# Patient Record
Sex: Male | Born: 1985 | Race: White | Hispanic: No | Marital: Single | Smoking: Never smoker
Health system: Southern US, Community
[De-identification: ages and names within clinical notes are randomized; demographics above are authoritative.]

## PROBLEM LIST (undated history)

## (undated) DIAGNOSIS — Z789 Other specified health status: Secondary | ICD-10-CM

## (undated) HISTORY — PX: TONSILLECTOMY: SUR1361

---

## 2011-09-21 ENCOUNTER — Other Ambulatory Visit: Payer: Self-pay | Admitting: Orthopedic Surgery

## 2011-09-21 ENCOUNTER — Ambulatory Visit
Admission: RE | Admit: 2011-09-21 | Discharge: 2011-09-21 | Disposition: A | Discharge status: Home / Self Care | Payer: Self-pay | Source: Ambulatory Visit | Attending: Orthopedic Surgery | Admitting: Orthopedic Surgery

## 2011-09-21 DIAGNOSIS — M25539 Pain in unspecified wrist: Secondary | ICD-10-CM

## 2013-06-01 ENCOUNTER — Encounter (HOSPITAL_COMMUNITY): Payer: Self-pay | Admitting: Emergency Medicine

## 2013-06-01 ENCOUNTER — Emergency Department (HOSPITAL_COMMUNITY)
Admission: EM | Admit: 2013-06-01 | Discharge: 2013-06-01 | Disposition: A | Discharge status: Home / Self Care | Payer: BC Managed Care – PPO | Source: Home / Self Care

## 2013-06-01 DIAGNOSIS — L259 Unspecified contact dermatitis, unspecified cause: Secondary | ICD-10-CM

## 2013-06-01 MED ORDER — TRIAMCINOLONE ACETONIDE 0.1 % EX CREA: TOPICAL_CREAM | CUTANEOUS | Status: DC

## 2013-06-01 MED ORDER — TRIAMCINOLONE ACETONIDE 40 MG/ML IJ SUSP
INTRAMUSCULAR | Status: AC
  Filled 2013-06-01: qty 2

## 2013-06-01 MED ORDER — MUPIROCIN CALCIUM 2 % EX CREA: 1.0000 "application " | TOPICAL_CREAM | Freq: Two times a day (BID) | CUTANEOUS | Status: DC

## 2013-06-01 MED ORDER — TRIAMCINOLONE ACETONIDE 40 MG/ML IJ SUSP
60.0000 mg | Freq: Once | INTRAMUSCULAR | Status: AC
  Administered 2013-06-01: 60 mg via INTRAMUSCULAR

## 2013-06-01 NOTE — ED Provider Notes (Signed)
CSN: [id number]     Arrival date & time 06/01/13  1217 History   First MD Initiated Contact with Patient 06/01/13 1339     Chief Complaint  Patient presents with  . Rash   (Consider location/radiation/quality/duration/timing/severity/associated sxs/prior Treatment) HPI Comments: Pt st was exposed to poison ivy about 7-10 d ago and developed rashes to his ankles and lower 4th of LE's.  He has been scratching them and now with honey colored drainage and small area of redness to healthy skin.    History reviewed. No pertinent past medical history. History reviewed. No pertinent past surgical history. No family history on file. History  Substance Use Topics  . Smoking status: Never Smoker   . Smokeless tobacco: Not on file  . Alcohol Use: No    Review of Systems  Constitutional: Negative.   Skin: Positive for rash.  All other systems reviewed and are negative.   Allergies  Review of patient's allergies indicates no known allergies.  Home Medications   Prior to Admission medications   Medication Sig Start Date End Date Taking? Authorizing Provider  mupirocin cream (BACTROBAN) 2 % Apply 1 application topically 2 (two) times daily. 06/01/13   Hayden Rasmussen, NP  triamcinolone cream (KENALOG) 0.1 % Apply to affected areas bid 06/01/13   Hayden Rasmussen, NP   BP 110/70  Pulse 89  Temp(Src) 98.5 F (36.9 C) (Oral)  Resp 16  SpO2 97% Physical Exam  Nursing note and vitals reviewed. Constitutional: He is oriented to person, place, and time. He appears well-developed and well-nourished. No distress.  Pulmonary/Chest: Effort normal. No respiratory distress.  Neurological: He is alert and oriented to person, place, and time.  Skin: Skin is warm and dry.  Crusty, thick , partly lichenified draining rash to the bilat ankles and lower 4th of L lower leg. Several papular lesions coalesced and isolated on an erythematous base. No lymphangitis.   Psychiatric: He has a normal mood and affect.    ED  Course  Procedures (including critical care time) Labs Review Labs Reviewed - No data to display  Imaging Review No results found.   MDM   1. Contact dermatitis     Keep covered, mupirocin cream bid and kenalog cream bid Kenalog 60 mg IM now Keep dry but clean with mild soap and cool water bid.    Hayden Rasmussen, NP 06/01/13 1414

## 2013-06-01 NOTE — ED Notes (Signed)
Pt  Has  Draining   Crusty  Weeping  Rash  To loweer  Legs  Possible  poisomn ivy  Exposure       Symptoms  X  6  Days    Getting  Worse

## 2013-06-01 NOTE — Discharge Instructions (Signed)
Contact Dermatitis °Contact dermatitis is a reaction to certain substances that touch the skin. Contact dermatitis can be either irritant contact dermatitis or allergic contact dermatitis. Irritant contact dermatitis does not require previous exposure to the substance for a reaction to occur. Allergic contact dermatitis only occurs if you have been exposed to the substance before. Upon a repeat exposure, your body reacts to the substance.  °CAUSES  °Many substances can cause contact dermatitis. Irritant dermatitis is most commonly caused by repeated exposure to mildly irritating substances, such as: °· Makeup. °· Soaps. °· Detergents. °· Bleaches. °· Acids. °· Metal salts, such as nickel. °Allergic contact dermatitis is most commonly caused by exposure to: °· Poisonous plants. °· Chemicals (deodorants, shampoos). °· Jewelry. °· Latex. °· Neomycin in triple antibiotic cream. °· Preservatives in products, including clothing. °SYMPTOMS  °The area of skin that is exposed may develop: °· Dryness or flaking. °· Redness. °· Cracks. °· Itching. °· Pain or a burning sensation. °· Blisters. °With allergic contact dermatitis, there may also be swelling in areas such as the eyelids, mouth, or genitals.  °DIAGNOSIS  °Your caregiver can usually tell what the problem is by doing a physical exam. In cases where the cause is uncertain and an allergic contact dermatitis is suspected, a patch skin test may be performed to help determine the cause of your dermatitis. °TREATMENT °Treatment includes protecting the skin from further contact with the irritating substance by avoiding that substance if possible. Barrier creams, powders, and gloves may be helpful. Your caregiver may also recommend: °· Steroid creams or ointments applied 2 times daily. For best results, soak the rash area in cool water for 20 minutes. Then apply the medicine. Cover the area with a plastic wrap. You can store the steroid cream in the refrigerator for a "chilly"  effect on your rash. That may decrease itching. Oral steroid medicines may be needed in more severe cases. °· Antibiotics or antibacterial ointments if a skin infection is present. °· Antihistamine lotion or an antihistamine taken by mouth to ease itching. °· Lubricants to keep moisture in your skin. °· Burow's solution to reduce redness and soreness or to dry a weeping rash. Mix one packet or tablet of solution in 2 cups cool water. Dip a clean washcloth in the mixture, wring it out a bit, and put it on the affected area. Leave the cloth in place for 30 minutes. Do this as often as possible throughout the day. °· Taking several cornstarch or baking soda baths daily if the area is too large to cover with a washcloth. °Harsh chemicals, such as alkalis or acids, can cause skin damage that is like a burn. You should flush your skin for 15 to 20 minutes with cold water after such an exposure. You should also seek immediate medical care after exposure. Bandages (dressings), antibiotics, and pain medicine may be needed for severely irritated skin.  °HOME CARE INSTRUCTIONS °· Avoid the substance that caused your reaction. °· Keep the area of skin that is affected away from hot water, soap, sunlight, chemicals, acidic substances, or anything else that would irritate your skin. °· Do not scratch the rash. Scratching may cause the rash to become infected. °· You may take cool baths to help stop the itching. °· Only take over-the-counter or prescription medicines as directed by your caregiver. °· See your caregiver for follow-up care as directed to make sure your skin is healing properly. °SEEK MEDICAL CARE IF:  °· Your condition is not better after 3   days of treatment. °· You seem to be getting worse. °· You see signs of infection such as swelling, tenderness, redness, soreness, or warmth in the affected area. °· You have any problems related to your medicines. °Document Released: 12/16/1999 Document Revised: 03/12/2011  Document Reviewed: 05/23/2010 °ExitCare® Patient Information ©2014 [employer]. ° °Poison Ivy °Poison ivy is a inflammation of the skin (contact dermatitis) caused by touching the allergens on the leaves of the ivy plant following previous exposure to the plant. The rash usually appears 48 hours after exposure. The rash is usually bumps (papules) or blisters (vesicles) in a linear pattern. Depending on your own sensitivity, the rash may simply cause redness and itching, or it may also progress to blisters which may break open. These must be well cared for to prevent secondary bacterial (germ) infection, followed by scarring. Keep any open areas dry, clean, dressed, and covered with an antibacterial ointment if needed. The eyes may also get puffy. The puffiness is worst in the morning and gets better as the day progresses. This dermatitis usually heals without scarring, within 2 to 3 weeks without treatment. °HOME CARE INSTRUCTIONS  °Thoroughly wash with soap and water as soon as you have been exposed to poison ivy. You have about one half hour to remove the plant resin before it will cause the rash. This washing will destroy the oil or antigen on the skin that is causing, or will cause, the rash. Be sure to wash under your fingernails as any plant resin there will continue to spread the rash. Do not rub skin vigorously when washing affected area. Poison ivy cannot spread if no oil from the plant remains on your body. A rash that has progressed to weeping sores will not spread the rash unless you have not washed thoroughly. It is also important to wash any clothes you have been wearing as these may carry active allergens. The rash will return if you wear the unwashed clothing, even several days later. °Avoidance of the plant in the future is the best measure. Poison ivy plant can be recognized by the number of leaves. Generally, poison ivy has three leaves with flowering branches on a single stem. °Diphenhydramine  may be purchased over the counter and used as needed for itching. Do not drive with this medication if it makes you drowsy.Ask your caregiver about medication for children. °SEEK MEDICAL CARE IF: °· Open sores develop. °· Redness spreads beyond area of rash. °· You notice purulent (pus-like) discharge. °· You have increased pain. °· Other signs of infection develop (such as fever). °Document Released: 12/16/1999 Document Revised: 03/12/2011 Document Reviewed: 11/03/2008 °ExitCare® Patient Information ©2014 [employer]. ° °

## 2013-06-02 NOTE — ED Provider Notes (Signed)
Medical screening examination/treatment/procedure(s) were performed by non-physician practitioner and as supervising physician I was immediately available for consultation/collaboration.  David Keller, M.D.  David C Keller, MD 06/02/13 2226 

## 2015-10-08 ENCOUNTER — Inpatient Hospital Stay (HOSPITAL_COMMUNITY)
Admission: EM | Admit: 2015-10-08 | Discharge: 2015-10-15 | DRG: 480 | Disposition: A | Discharge status: Home Health | Payer: BLUE CROSS/BLUE SHIELD | Attending: Orthopedic Surgery | Admitting: Orthopedic Surgery

## 2015-10-08 ENCOUNTER — Emergency Department (HOSPITAL_COMMUNITY): Payer: BLUE CROSS/BLUE SHIELD

## 2015-10-08 ENCOUNTER — Inpatient Hospital Stay (HOSPITAL_COMMUNITY): Payer: BLUE CROSS/BLUE SHIELD

## 2015-10-08 ENCOUNTER — Encounter (HOSPITAL_COMMUNITY): Payer: Self-pay | Admitting: Emergency Medicine

## 2015-10-08 ENCOUNTER — Encounter (HOSPITAL_COMMUNITY)
Admission: EM | Disposition: A | Discharge status: Home Health | Payer: Self-pay | Source: Home / Self Care | Attending: Orthopedic Surgery

## 2015-10-08 ENCOUNTER — Emergency Department (HOSPITAL_COMMUNITY): Payer: BLUE CROSS/BLUE SHIELD | Admitting: Certified Registered"

## 2015-10-08 DIAGNOSIS — S62111A Displaced fracture of triquetrum [cuneiform] bone, right wrist, initial encounter for closed fracture: Secondary | ICD-10-CM | POA: Diagnosis present

## 2015-10-08 DIAGNOSIS — E669 Obesity, unspecified: Secondary | ICD-10-CM | POA: Diagnosis present

## 2015-10-08 DIAGNOSIS — R2689 Other abnormalities of gait and mobility: Secondary | ICD-10-CM

## 2015-10-08 DIAGNOSIS — Z6831 Body mass index (BMI) 31.0-31.9, adult: Secondary | ICD-10-CM | POA: Diagnosis not present

## 2015-10-08 DIAGNOSIS — S82041B Displaced comminuted fracture of right patella, initial encounter for open fracture type I or II: Secondary | ICD-10-CM | POA: Diagnosis present

## 2015-10-08 DIAGNOSIS — S92811A Other fracture of right foot, initial encounter for closed fracture: Secondary | ICD-10-CM | POA: Diagnosis present

## 2015-10-08 DIAGNOSIS — T148XXA Other injury of unspecified body region, initial encounter: Secondary | ICD-10-CM

## 2015-10-08 DIAGNOSIS — Z23 Encounter for immunization: Secondary | ICD-10-CM

## 2015-10-08 DIAGNOSIS — D62 Acute posthemorrhagic anemia: Secondary | ICD-10-CM | POA: Diagnosis not present

## 2015-10-08 DIAGNOSIS — S82009A Unspecified fracture of unspecified patella, initial encounter for closed fracture: Secondary | ICD-10-CM

## 2015-10-08 DIAGNOSIS — S93324A Dislocation of tarsometatarsal joint of right foot, initial encounter: Secondary | ICD-10-CM | POA: Diagnosis present

## 2015-10-08 DIAGNOSIS — M25561 Pain in right knee: Secondary | ICD-10-CM | POA: Diagnosis present

## 2015-10-08 DIAGNOSIS — S72141A Displaced intertrochanteric fracture of right femur, initial encounter for closed fracture: Secondary | ICD-10-CM | POA: Diagnosis present

## 2015-10-08 DIAGNOSIS — Y92410 Unspecified street and highway as the place of occurrence of the external cause: Secondary | ICD-10-CM | POA: Diagnosis not present

## 2015-10-08 DIAGNOSIS — S52511A Displaced fracture of right radial styloid process, initial encounter for closed fracture: Secondary | ICD-10-CM | POA: Diagnosis present

## 2015-10-08 DIAGNOSIS — S82041C Displaced comminuted fracture of right patella, initial encounter for open fracture type IIIA, IIIB, or IIIC: Secondary | ICD-10-CM | POA: Diagnosis present

## 2015-10-08 DIAGNOSIS — S7291XA Unspecified fracture of right femur, initial encounter for closed fracture: Secondary | ICD-10-CM | POA: Diagnosis present

## 2015-10-08 DIAGNOSIS — S92901A Unspecified fracture of right foot, initial encounter for closed fracture: Secondary | ICD-10-CM

## 2015-10-08 DIAGNOSIS — S52561A Barton's fracture of right radius, initial encounter for closed fracture: Secondary | ICD-10-CM | POA: Diagnosis present

## 2015-10-08 DIAGNOSIS — S62101A Fracture of unspecified carpal bone, right wrist, initial encounter for closed fracture: Secondary | ICD-10-CM

## 2015-10-08 HISTORY — PX: ORIF PATELLA: SHX5033

## 2015-10-08 HISTORY — DX: Other specified health status: Z78.9

## 2015-10-08 HISTORY — PX: FEMUR IM NAIL: SHX1597

## 2015-10-08 LAB — CBC WITH DIFFERENTIAL/PLATELET
Basophils Absolute: 0 10*3/uL (ref 0.0–0.1)
Basophils Relative: 0 %
Eosinophils Absolute: 0.2 10*3/uL (ref 0.0–0.7)
Eosinophils Relative: 1 %
HCT: 42 % (ref 39.0–52.0)
Hemoglobin: 14 g/dL (ref 13.0–17.0)
Lymphocytes Relative: 22 %
Lymphs Abs: 2.7 10*3/uL (ref 0.7–4.0)
MCH: 30.5 pg (ref 26.0–34.0)
MCHC: 33.3 g/dL (ref 30.0–36.0)
MCV: 91.5 fL (ref 78.0–100.0)
Monocytes Absolute: 1 10*3/uL (ref 0.1–1.0)
Monocytes Relative: 8 %
Neutro Abs: 8.5 10*3/uL — ABNORMAL HIGH (ref 1.7–7.7)
Neutrophils Relative %: 69 %
Platelets: 214 10*3/uL (ref 150–400)
RBC: 4.59 MIL/uL (ref 4.22–5.81)
RDW: 12.9 % (ref 11.5–15.5)
WBC: 12.3 10*3/uL — ABNORMAL HIGH (ref 4.0–10.5)

## 2015-10-08 LAB — BASIC METABOLIC PANEL
Anion gap: 6 (ref 5–15)
BUN: 18 mg/dL (ref 6–20)
CO2: 26 mmol/L (ref 22–32)
Calcium: 8.6 mg/dL — ABNORMAL LOW (ref 8.9–10.3)
Chloride: 106 mmol/L (ref 101–111)
Creatinine, Ser: 0.93 mg/dL (ref 0.61–1.24)
GFR calc Af Amer: >60 mL/min (ref >60)
GFR calc non Af Amer: >60 mL/min (ref >60)
Glucose, Bld: 147 mg/dL — ABNORMAL HIGH (ref 65–99)
Potassium: 3.2 mmol/L — ABNORMAL LOW (ref 3.5–5.1)
Sodium: 138 mmol/L (ref 135–145)

## 2015-10-08 LAB — TYPE AND SCREEN
ABO/RH(D): O POS
Antibody Screen: NEGATIVE

## 2015-10-08 LAB — ABO/RH: ABO/RH(D): O POS

## 2015-10-08 SURGERY — INSERTION, INTRAMEDULLARY ROD, FEMUR
Anesthesia: General | Site: Leg Upper | Laterality: Right

## 2015-10-08 MED ORDER — POLYETHYLENE GLYCOL 3350 17 G PO PACK
17.0000 g | PACK | Freq: Every day | ORAL | Status: DC | PRN
Start: 2015-10-08 — End: 2015-10-14

## 2015-10-08 MED ORDER — ACETAMINOPHEN 500 MG PO TABS
1000.0000 mg | ORAL_TABLET | Freq: Four times a day (QID) | ORAL | Status: DC
  Administered 2015-10-08 – 2015-10-09 (×2): 1000 mg via ORAL
  Filled 2015-10-08 (×2): qty 2

## 2015-10-08 MED ORDER — 0.9 % SODIUM CHLORIDE (POUR BTL) OPTIME
TOPICAL | Status: DC | PRN
  Administered 2015-10-08: 1000 mL

## 2015-10-08 MED ORDER — CEFAZOLIN SODIUM-DEXTROSE 2-4 GM/100ML-% IV SOLN
INTRAVENOUS | Status: AC
  Filled 2015-10-08: qty 100

## 2015-10-08 MED ORDER — FENTANYL CITRATE (PF) 100 MCG/2ML IJ SOLN
INTRAMUSCULAR | Status: AC
  Filled 2015-10-08: qty 4

## 2015-10-08 MED ORDER — FLEET ENEMA 7-19 GM/118ML RE ENEM: 1.0000 | ENEMA | Freq: Once | RECTAL | Status: DC | PRN

## 2015-10-08 MED ORDER — OXYCODONE HCL 5 MG/5ML PO SOLN: 5.0000 mg | Freq: Once | ORAL | Status: DC | PRN

## 2015-10-08 MED ORDER — HYDROMORPHONE HCL 1 MG/ML IJ SOLN: 0.2500 mg | INTRAMUSCULAR | Status: DC | PRN

## 2015-10-08 MED ORDER — ROCURONIUM BROMIDE 10 MG/ML (PF) SYRINGE
PREFILLED_SYRINGE | INTRAVENOUS | Status: AC
  Filled 2015-10-08: qty 20

## 2015-10-08 MED ORDER — METHOCARBAMOL 500 MG PO TABS
500.0000 mg | ORAL_TABLET | Freq: Four times a day (QID) | ORAL | Status: DC | PRN
  Administered 2015-10-08 – 2015-10-14 (×8): 500 mg via ORAL
  Filled 2015-10-08 (×9): qty 1

## 2015-10-08 MED ORDER — MIDAZOLAM HCL 2 MG/2ML IJ SOLN
INTRAMUSCULAR | Status: AC
  Filled 2015-10-08: qty 2

## 2015-10-08 MED ORDER — LIDOCAINE HCL (CARDIAC) 20 MG/ML IV SOLN
INTRAVENOUS | Status: DC | PRN
  Administered 2015-10-08: 60 mg via INTRAVENOUS

## 2015-10-08 MED ORDER — OXYCODONE HCL 5 MG PO TABS: 5.0000 mg | ORAL_TABLET | Freq: Once | ORAL | Status: DC | PRN

## 2015-10-08 MED ORDER — SUCCINYLCHOLINE CHLORIDE 200 MG/10ML IV SOSY
PREFILLED_SYRINGE | INTRAVENOUS | Status: AC
  Filled 2015-10-08: qty 10

## 2015-10-08 MED ORDER — CEFAZOLIN SODIUM 1 G IJ SOLR
INTRAMUSCULAR | Status: AC
  Filled 2015-10-08: qty 20

## 2015-10-08 MED ORDER — METOCLOPRAMIDE HCL 5 MG/ML IJ SOLN: 5.0000 mg | Freq: Three times a day (TID) | INTRAMUSCULAR | Status: DC | PRN

## 2015-10-08 MED ORDER — ROCURONIUM BROMIDE 10 MG/ML (PF) SYRINGE
PREFILLED_SYRINGE | INTRAVENOUS | Status: AC
  Filled 2015-10-08: qty 10

## 2015-10-08 MED ORDER — LACTATED RINGERS IV SOLN
INTRAVENOUS | Status: DC
  Administered 2015-10-09 – 2015-10-11 (×4): via INTRAVENOUS

## 2015-10-08 MED ORDER — MORPHINE SULFATE (PF) 4 MG/ML IV SOLN
6.0000 mg | INTRAVENOUS | Status: DC | PRN
  Administered 2015-10-08: 6 mg via INTRAVENOUS
  Filled 2015-10-08: qty 2

## 2015-10-08 MED ORDER — EPHEDRINE 5 MG/ML INJ
INTRAVENOUS | Status: AC
  Filled 2015-10-08: qty 10

## 2015-10-08 MED ORDER — METOCLOPRAMIDE HCL 5 MG PO TABS
5.0000 mg | ORAL_TABLET | Freq: Three times a day (TID) | ORAL | Status: DC | PRN
Start: 2015-10-08 — End: 2015-10-15

## 2015-10-08 MED ORDER — ALBUMIN HUMAN 5 % IV SOLN
INTRAVENOUS | Status: DC | PRN
  Administered 2015-10-08: 14:00:00 via INTRAVENOUS

## 2015-10-08 MED ORDER — PROPOFOL 10 MG/ML IV BOLUS
INTRAVENOUS | Status: AC
  Filled 2015-10-08: qty 20

## 2015-10-08 MED ORDER — ONDANSETRON HCL 4 MG/2ML IJ SOLN
INTRAMUSCULAR | Status: AC
  Filled 2015-10-08: qty 2

## 2015-10-08 MED ORDER — ACETAMINOPHEN 650 MG RE SUPP: 650.0000 mg | Freq: Four times a day (QID) | RECTAL | Status: DC | PRN

## 2015-10-08 MED ORDER — FENTANYL CITRATE (PF) 100 MCG/2ML IJ SOLN
INTRAMUSCULAR | Status: AC
  Filled 2015-10-08: qty 2

## 2015-10-08 MED ORDER — ACETAMINOPHEN 325 MG PO TABS
650.0000 mg | ORAL_TABLET | Freq: Four times a day (QID) | ORAL | Status: DC | PRN
  Administered 2015-10-10 – 2015-10-14 (×4): 650 mg via ORAL
  Filled 2015-10-08 (×3): qty 2

## 2015-10-08 MED ORDER — OXYCODONE HCL 5 MG PO TABS
5.0000 mg | ORAL_TABLET | ORAL | Status: DC | PRN
  Administered 2015-10-08 – 2015-10-13 (×19): 10 mg via ORAL
  Filled 2015-10-08 (×19): qty 2

## 2015-10-08 MED ORDER — FENTANYL CITRATE (PF) 100 MCG/2ML IJ SOLN
INTRAMUSCULAR | Status: DC | PRN
  Administered 2015-10-08 (×4): 50 ug via INTRAVENOUS
  Administered 2015-10-08: 100 ug via INTRAVENOUS
  Administered 2015-10-08 (×2): 50 ug via INTRAVENOUS

## 2015-10-08 MED ORDER — MORPHINE SULFATE (PF) 2 MG/ML IV SOLN
2.0000 mg | INTRAVENOUS | Status: DC | PRN
  Administered 2015-10-10 (×2): 2 mg via INTRAVENOUS
  Filled 2015-10-08 (×3): qty 1

## 2015-10-08 MED ORDER — ROCURONIUM BROMIDE 100 MG/10ML IV SOLN
INTRAVENOUS | Status: DC | PRN
  Administered 2015-10-08: 20 mg via INTRAVENOUS
  Administered 2015-10-08: 60 mg via INTRAVENOUS
  Administered 2015-10-08: 10 mg via INTRAVENOUS
  Administered 2015-10-08 (×2): 20 mg via INTRAVENOUS

## 2015-10-08 MED ORDER — ONDANSETRON HCL 4 MG/2ML IJ SOLN
4.0000 mg | Freq: Four times a day (QID) | INTRAMUSCULAR | Status: DC | PRN
Start: 2015-10-08 — End: 2015-10-15

## 2015-10-08 MED ORDER — LIDOCAINE-EPINEPHRINE 2 %-1:100000 IJ SOLN
INTRAMUSCULAR | Status: DC | PRN
  Administered 2015-10-08: 5 mL via PERINEURAL

## 2015-10-08 MED ORDER — CEFAZOLIN SODIUM-DEXTROSE 2-4 GM/100ML-% IV SOLN
2.0000 g | Freq: Once | INTRAVENOUS | Status: AC
  Administered 2015-10-08: 2 g via INTRAVENOUS

## 2015-10-08 MED ORDER — SODIUM CHLORIDE 0.9 % IV BOLUS (SEPSIS)
1000.0000 mL | Freq: Once | INTRAVENOUS | Status: AC
  Administered 2015-10-08: 1000 mL via INTRAVENOUS

## 2015-10-08 MED ORDER — MIDAZOLAM HCL 2 MG/2ML IJ SOLN
INTRAMUSCULAR | Status: AC
Start: 2015-10-08 — End: 2015-10-08
  Filled 2015-10-08: qty 2

## 2015-10-08 MED ORDER — LIDOCAINE 2% (20 MG/ML) 5 ML SYRINGE
INTRAMUSCULAR | Status: AC
  Filled 2015-10-08: qty 5

## 2015-10-08 MED ORDER — ACETAMINOPHEN 325 MG PO TABS: 325.0000 mg | ORAL_TABLET | ORAL | Status: DC | PRN

## 2015-10-08 MED ORDER — ENOXAPARIN SODIUM 40 MG/0.4ML ~~LOC~~ SOLN
40.0000 mg | SUBCUTANEOUS | Status: DC
  Administered 2015-10-09 – 2015-10-15 (×6): 40 mg via SUBCUTANEOUS
  Filled 2015-10-08 (×6): qty 0.4

## 2015-10-08 MED ORDER — ACETAMINOPHEN 160 MG/5ML PO SOLN
325.0000 mg | ORAL | Status: DC | PRN
  Filled 2015-10-08: qty 20.3

## 2015-10-08 MED ORDER — BUPIVACAINE-EPINEPHRINE (PF) 0.5% -1:200000 IJ SOLN
INTRAMUSCULAR | Status: DC | PRN
  Administered 2015-10-08: 25 mL via PERINEURAL

## 2015-10-08 MED ORDER — KETOROLAC TROMETHAMINE 15 MG/ML IJ SOLN
15.0000 mg | Freq: Four times a day (QID) | INTRAMUSCULAR | Status: AC
  Administered 2015-10-08 – 2015-10-09 (×3): 15 mg via INTRAVENOUS
  Filled 2015-10-08 (×3): qty 1

## 2015-10-08 MED ORDER — ONDANSETRON HCL 4 MG/2ML IJ SOLN
INTRAMUSCULAR | Status: DC | PRN
Start: 2015-10-08 — End: 2015-10-08
  Administered 2015-10-08: 4 mg via INTRAVENOUS

## 2015-10-08 MED ORDER — TETANUS-DIPHTH-ACELL PERTUSSIS 5-2.5-18.5 LF-MCG/0.5 IM SUSP
0.5000 mL | Freq: Once | INTRAMUSCULAR | Status: AC
  Administered 2015-10-08: 0.5 mL via INTRAMUSCULAR
  Filled 2015-10-08: qty 0.5

## 2015-10-08 MED ORDER — CEFAZOLIN SODIUM-DEXTROSE 2-3 GM-% IV SOLR
INTRAVENOUS | Status: DC | PRN
  Administered 2015-10-08 (×2): 2 g via INTRAVENOUS

## 2015-10-08 MED ORDER — PROPOFOL 10 MG/ML IV BOLUS
INTRAVENOUS | Status: DC | PRN
  Administered 2015-10-08: 160 mg via INTRAVENOUS

## 2015-10-08 MED ORDER — SENNA 8.6 MG PO TABS
1.0000 | ORAL_TABLET | Freq: Two times a day (BID) | ORAL | Status: DC
  Administered 2015-10-08 – 2015-10-15 (×12): 8.6 mg via ORAL
  Filled 2015-10-08 (×12): qty 1

## 2015-10-08 MED ORDER — SODIUM CHLORIDE 0.9 % IR SOLN
Status: DC | PRN
  Administered 2015-10-08: 6000 mL

## 2015-10-08 MED ORDER — PHENYLEPHRINE 40 MCG/ML (10ML) SYRINGE FOR IV PUSH (FOR BLOOD PRESSURE SUPPORT)
PREFILLED_SYRINGE | INTRAVENOUS | Status: DC | PRN
  Administered 2015-10-08 (×2): 120 ug via INTRAVENOUS

## 2015-10-08 MED ORDER — LACTATED RINGERS IV SOLN
INTRAVENOUS | Status: DC | PRN
  Administered 2015-10-08 (×4): via INTRAVENOUS

## 2015-10-08 MED ORDER — FENTANYL CITRATE (PF) 100 MCG/2ML IJ SOLN
INTRAMUSCULAR | Status: AC | PRN
  Administered 2015-10-08: 100 ug via INTRAVENOUS

## 2015-10-08 MED ORDER — DOCUSATE SODIUM 100 MG PO CAPS
100.0000 mg | ORAL_CAPSULE | Freq: Two times a day (BID) | ORAL | Status: DC
  Administered 2015-10-08 – 2015-10-15 (×12): 100 mg via ORAL
  Filled 2015-10-08 (×12): qty 1

## 2015-10-08 MED ORDER — BISACODYL 10 MG RE SUPP: 10.0000 mg | Freq: Every day | RECTAL | Status: DC | PRN

## 2015-10-08 MED ORDER — CEFAZOLIN SODIUM-DEXTROSE 2-4 GM/100ML-% IV SOLN
2.0000 g | Freq: Four times a day (QID) | INTRAVENOUS | Status: AC
  Administered 2015-10-08 – 2015-10-09 (×2): 2 g via INTRAVENOUS
  Filled 2015-10-08 (×3): qty 100

## 2015-10-08 MED ORDER — ONDANSETRON HCL 4 MG PO TABS: 4.0000 mg | ORAL_TABLET | Freq: Four times a day (QID) | ORAL | Status: DC | PRN

## 2015-10-08 MED ORDER — METHOCARBAMOL 1000 MG/10ML IJ SOLN
500.0000 mg | Freq: Four times a day (QID) | INTRAVENOUS | Status: DC | PRN
  Filled 2015-10-08: qty 5

## 2015-10-08 MED ORDER — DIPHENHYDRAMINE HCL 12.5 MG/5ML PO ELIX: 12.5000 mg | ORAL_SOLUTION | ORAL | Status: DC | PRN

## 2015-10-08 SURGICAL SUPPLY — 105 items
BANDAGE ACE 4X5 VEL STRL LF (GAUZE/BANDAGES/DRESSINGS) IMPLANT
BANDAGE ACE 6X5 VEL STRL LF (GAUZE/BANDAGES/DRESSINGS) ×3 IMPLANT
BANDAGE ESMARK 6X9 LF (GAUZE/BANDAGES/DRESSINGS) IMPLANT
BENZOIN TINCTURE PRP APPL 2/3 (GAUZE/BANDAGES/DRESSINGS) ×3 IMPLANT
BIT DRILL AO GAMMA 4.2X180 (BIT) ×3 IMPLANT
BIT DRILL CANN 2.7 (BIT) ×2
BIT DRILL CANN 2.7 ASNIS III (BIT) ×3 IMPLANT
BIT DRILL SRG 2.7XCANN AO CPLG (BIT) ×4 IMPLANT
BIT DRL SRG 2.7XCANN AO CPLNG (BIT) ×4
BLADE SURG 10 STRL SS (BLADE) IMPLANT
BLADE SURG 15 STRL LF DISP TIS (BLADE) IMPLANT
BLADE SURG 15 STRL SS (BLADE)
BLADE SURG ROTATE 9660 (MISCELLANEOUS) IMPLANT
BNDG COHESIVE 4X5 TAN STRL (GAUZE/BANDAGES/DRESSINGS) IMPLANT
BNDG COHESIVE 6X5 TAN STRL LF (GAUZE/BANDAGES/DRESSINGS) ×9 IMPLANT
BNDG ESMARK 6X9 LF (GAUZE/BANDAGES/DRESSINGS)
BNDG GAUZE ELAST 4 BULKY (GAUZE/BANDAGES/DRESSINGS) ×3 IMPLANT
COVER MAYO STAND STRL (DRAPES) ×3 IMPLANT
COVER PERINEAL POST (MISCELLANEOUS) ×3 IMPLANT
COVER SURGICAL LIGHT HANDLE (MISCELLANEOUS) ×6 IMPLANT
CUFF TOURNIQUET SINGLE 34IN LL (TOURNIQUET CUFF) IMPLANT
DRAPE C-ARM 42X72 X-RAY (DRAPES) ×3 IMPLANT
DRAPE C-ARMOR (DRAPES) ×3 IMPLANT
DRAPE IMP U-DRAPE 54X76 (DRAPES) ×6 IMPLANT
DRAPE INCISE IOBAN 66X45 STRL (DRAPES) ×3 IMPLANT
DRAPE ORTHO SPLIT 77X108 STRL (DRAPES) ×4
DRAPE PROXIMA HALF (DRAPES) ×6 IMPLANT
DRAPE STERI IOBAN 125X83 (DRAPES) ×3 IMPLANT
DRAPE SURG ORHT 6 SPLT 77X108 (DRAPES) ×8 IMPLANT
DRAPE U-SHAPE 47X51 STRL (DRAPES) ×3 IMPLANT
DRSG ADAPTIC 3X8 NADH LF (GAUZE/BANDAGES/DRESSINGS) ×3 IMPLANT
DRSG MEPILEX BORDER 4X4 (GAUZE/BANDAGES/DRESSINGS) ×3 IMPLANT
DRSG MEPILEX BORDER 4X8 (GAUZE/BANDAGES/DRESSINGS) IMPLANT
DRSG PAD ABDOMINAL 8X10 ST (GAUZE/BANDAGES/DRESSINGS) ×3 IMPLANT
DURAPREP 26ML APPLICATOR (WOUND CARE) ×6 IMPLANT
ELECT REM PT RETURN 9FT ADLT (ELECTROSURGICAL) ×3
ELECTRODE REM PT RTRN 9FT ADLT (ELECTROSURGICAL) ×2 IMPLANT
GAUZE SPONGE 4X4 12PLY STRL (GAUZE/BANDAGES/DRESSINGS) ×3 IMPLANT
GLOVE BIO SURGEON STRL SZ7.5 (GLOVE) ×18 IMPLANT
GLOVE BIOGEL PI IND STRL 7.0 (GLOVE) ×4 IMPLANT
GLOVE BIOGEL PI IND STRL 7.5 (GLOVE) ×4 IMPLANT
GLOVE BIOGEL PI IND STRL 8 (GLOVE) ×8 IMPLANT
GLOVE BIOGEL PI INDICATOR 7.0 (GLOVE) ×2
GLOVE BIOGEL PI INDICATOR 7.5 (GLOVE) ×2
GLOVE BIOGEL PI INDICATOR 8 (GLOVE) ×4
GLOVE SKINSENSE NS SZ7.5 (GLOVE) ×2
GLOVE SKINSENSE STRL SZ7.5 (GLOVE) ×4 IMPLANT
GOWN STRL REUS W/ TWL LRG LVL3 (GOWN DISPOSABLE) ×12 IMPLANT
GOWN STRL REUS W/ TWL XL LVL3 (GOWN DISPOSABLE) ×2 IMPLANT
GOWN STRL REUS W/TWL LRG LVL3 (GOWN DISPOSABLE) ×6
GOWN STRL REUS W/TWL XL LVL3 (GOWN DISPOSABLE) ×1
GUIDEROD T2 3X1000 (ROD) ×3 IMPLANT
IMMOBILIZER KNEE 22 UNIV (SOFTGOODS) IMPLANT
K-WIRE  3.2X450M STR (WIRE) ×1
K-WIRE 3.2X450M STR (WIRE) ×2
K-WIRE ORTHOPEDIC 1.4X150L (WIRE) ×6
KIT BASIN OR (CUSTOM PROCEDURE TRAY) IMPLANT
KIT ROOM TURNOVER OR (KITS) ×3 IMPLANT
KWIRE 3.2X450M STR (WIRE) ×2 IMPLANT
KWIRE ORTHOPEDIC 1.4X150L (WIRE) ×4 IMPLANT
MANIFOLD NEPTUNE II (INSTRUMENTS) IMPLANT
NAIL LONG KIT TIGHR 10X400-125 (Nail) ×3 IMPLANT
NDL SUT 6 .5 CRC .975X.05 MAYO (NEEDLE) IMPLANT
NEEDLE 22X1 1/2 (OR ONLY) (NEEDLE) IMPLANT
NEEDLE MAYO TAPER (NEEDLE)
NS IRRIG 1000ML POUR BTL (IV SOLUTION) ×3 IMPLANT
PACK GENERAL/GYN (CUSTOM PROCEDURE TRAY) ×3 IMPLANT
PACK ORTHO EXTREMITY (CUSTOM PROCEDURE TRAY) IMPLANT
PACK UNIVERSAL I (CUSTOM PROCEDURE TRAY) IMPLANT
PAD ARMBOARD 7.5X6 YLW CONV (MISCELLANEOUS) ×18 IMPLANT
PADDING CAST ABS 6INX4YD NS (CAST SUPPLIES) ×1
PADDING CAST ABS COTTON 6X4 NS (CAST SUPPLIES) ×2 IMPLANT
PASSER SUT SWANSON 36MM LOOP (INSTRUMENTS) ×3 IMPLANT
REAMER SHAFT BIXCUT (INSTRUMENTS) ×3 IMPLANT
RETRIEVER SUT LRG (INSTRUMENTS) ×3 IMPLANT
SCREW BONE CANN 4.0X26MM (Screw) ×3 IMPLANT
SCREW BONE CANN 4.0X50MM (Screw) ×3 IMPLANT
SCREW LAG GAMMA 3 TI 10.5X90MM (Screw) ×3 IMPLANT
SCREW LOCKING THREADED 5X47.5 (Screw) ×3 IMPLANT
SET CYSTO W/LG BORE CLAMP LF (SET/KITS/TRAYS/PACK) ×3 IMPLANT
SPONGE LAP 18X18 X RAY DECT (DISPOSABLE) ×3 IMPLANT
STOCKINETTE IMPERVIOUS LG (DRAPES) IMPLANT
STRIP CLOSURE SKIN 1/2X4 (GAUZE/BANDAGES/DRESSINGS) ×3 IMPLANT
SUCTION FRAZIER HANDLE 10FR (MISCELLANEOUS) ×1
SUCTION TUBE FRAZIER 10FR DISP (MISCELLANEOUS) ×2 IMPLANT
SUT ETHILON 2 0 FS 18 (SUTURE) ×6 IMPLANT
SUT ETHILON 3 0 PS 1 (SUTURE) ×6 IMPLANT
SUT FIBERWIRE #2 38 T-5 BLUE (SUTURE) ×6
SUT FIBERWIRE 2-0 18 17.9 3/8 (SUTURE) ×3
SUT MNCRL AB 3-0 PS2 18 (SUTURE) ×3 IMPLANT
SUT MNCRL AB 4-0 PS2 18 (SUTURE) ×3 IMPLANT
SUT MON AB 2-0 CT1 27 (SUTURE) ×6 IMPLANT
SUT MON AB 2-0 CT1 36 (SUTURE) IMPLANT
SUT VIC AB 0 CT1 27 (SUTURE) ×3
SUT VIC AB 0 CT1 27XBRD ANBCTR (SUTURE) ×6 IMPLANT
SUTURE FIBERWR #2 38 T-5 BLUE (SUTURE) ×4 IMPLANT
SUTURE FIBERWR 2-0 18 17.9 3/8 (SUTURE) ×2 IMPLANT
SYR CONTROL 10ML LL (SYRINGE) IMPLANT
TOWEL OR 17X24 6PK STRL BLUE (TOWEL DISPOSABLE) ×3 IMPLANT
TOWEL OR 17X26 10 PK STRL BLUE (TOWEL DISPOSABLE) ×3 IMPLANT
TOWEL OR NON WOVEN STRL DISP B (DISPOSABLE) IMPLANT
TRAY FOLEY CATH 14FR (SET/KITS/TRAYS/PACK) IMPLANT
TUBE CONNECTING 12X1/4 (SUCTIONS) ×3 IMPLANT
WATER STERILE IRR 1000ML POUR (IV SOLUTION) ×3 IMPLANT
YANKAUER SUCT BULB TIP NO VENT (SUCTIONS) ×3 IMPLANT

## 2015-10-08 NOTE — Progress Notes (Signed)
Radiology in to take films postop after Ortho applied splints to rt ankle and rt wrist

## 2015-10-08 NOTE — ED Notes (Signed)
Patient transported to CT 

## 2015-10-08 NOTE — Progress Notes (Signed)
Patient arrived to the unit from PACU. Sling in place to right arm, knee immobilizer and splint to right lower extremity. Resting in bed quietly. No complaints of pain at this time. Breathing even and unlabored. IV infusing. Offered clear liquid diet. Family at bedside. Night shift to assess and continue to monitor.

## 2015-10-08 NOTE — ED Notes (Signed)
X-ray at bedside
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
## 2015-10-08 NOTE — Progress Notes (Signed)
Orthopedic Tech Progress Note Patient Details:  Kelvin CellarBarry D Vanness 01/01/1985 [id number]  Ortho Devices Type of Ortho Device: Knee Immobilizer, Ace wrap, Volar splint Ortho Device/Splint Interventions: Application   Saul FordyceJennifer C Koontz 10/08/2015, 10:26 AM

## 2015-10-08 NOTE — Anesthesia Postprocedure Evaluation (Signed)
Anesthesia Post Note  Patient: Kelvin CellarBarry D Kolek  Procedure(s) Performed: Procedure(s) (LRB): INTRAMEDULLARY (IM) NAIL FEMORAL (Right) OPEN REDUCTION INTERNAL (ORIF) FIXATION PATELLA (Right)  Patient location during evaluation: PACU Anesthesia Type: General and Regional Level of consciousness: awake Pain management: pain level controlled Vital Signs Assessment: post-procedure vital signs reviewed and stable Respiratory status: spontaneous breathing Cardiovascular status: stable Postop Assessment: no signs of nausea or vomiting Anesthetic complications: no    Last Vitals:  Vitals:   10/08/15 1820 10/08/15 1839  BP: (!) 143/78 (!) 143/73  Pulse: (!) 107 94  Resp: 13 16  Temp:  37.1 C    Last Pain:  Vitals:   10/08/15 1839  TempSrc: Oral  PainSc:                  CHRISTOPHER MOSER
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
## 2015-10-08 NOTE — Anesthesia Procedure Notes (Addendum)
Anesthesia Regional Block:  Femoral nerve block  Pre-Anesthetic Checklist: ,, timeout performed, Correct Patient, Correct Site, Correct Laterality, Correct Procedure, Correct Position, site marked, Risks and benefits discussed,  Surgical consent,  Pre-op evaluation,  At surgeon's request and post-op pain management  Laterality: Lower and Right  Prep: chloraprep       Needles:  Injection technique: Single-shot  Needle Type: Echogenic Stimulator Needle          Additional Needles:  Procedures: ultrasound guided (picture in chart) Femoral nerve block Narrative:  Injection made incrementally with aspirations every 5 mL.  Performed by: Personally  Anesthesiologist: MOSER, CHRISTOPHER  Additional Notes: H+P and labs reviewed, risks and benefits discussed with patient, procedure tolerated well without complications      

## 2015-10-08 NOTE — Progress Notes (Signed)
Orthopedic Tech Progress Note Patient Details:  Kelvin CellarBarry D Livingood 05/27/1985 [id number]  Ortho Devices Type of Ortho Device: Ace wrap, Sugartong splint, Post (short leg) splint Ortho Device/Splint Interventions: Application   Saul FordyceJennifer C Koontz 10/08/2015, 6:07 PM

## 2015-10-08 NOTE — Transfer of Care (Signed)
Immediate Anesthesia Transfer of Care Note  Patient: Ronald Hayes  Procedure(s) Performed: Procedure(s): INTRAMEDULLARY (IM) NAIL FEMORAL (Right) OPEN REDUCTION INTERNAL (ORIF) FIXATION PATELLA (Right)  Patient Location: PACU  Anesthesia Type:General  Level of Consciousness: sedated  Airway & Oxygen Therapy: Patient Spontanous Breathing and Patient connected to nasal cannula oxygen  Post-op Assessment: Report given to RN and Post -op Vital signs reviewed and stable  Post vital signs: Reviewed and stable  Last Vitals:  Vitals:   10/08/15 1045 10/08/15 1703  BP: 129/67 (!) (P) 145/69  Pulse: 73 (!) (P) 128  Resp: 16 (P) 16  Temp:  (P) 36.3 C    Last Pain:  Vitals:   10/08/15 0951  PainSc: 7          Complications: No apparent anesthesia complications

## 2015-10-08 NOTE — Anesthesia Preprocedure Evaluation (Addendum)
Anesthesia Evaluation  Patient identified by MRN, date of birth, ID band Patient awake    Reviewed: Allergy & Precautions, NPO status , Patient's Chart, lab work & pertinent test results  History of Anesthesia Complications Negative for: history of anesthetic complications  Airway Mallampati: II  TM Distance: >3 FB Neck ROM: Full    Dental  (+) Teeth Intact, Dental Advisory Given   Pulmonary neg pulmonary ROS,    breath sounds clear to auscultation       Cardiovascular negative cardio ROS   Rhythm:Regular     Neuro/Psych negative neurological ROS  negative psych ROS   GI/Hepatic negative GI ROS, Neg liver ROS,   Endo/Other  negative endocrine ROS  Renal/GU negative Renal ROS     Musculoskeletal   Abdominal   Peds  Hematology negative hematology ROS (+)   Anesthesia Other Findings   Reproductive/Obstetrics                            Anesthesia Physical Anesthesia Plan  ASA: I  Anesthesia Plan: General and Regional   Post-op Pain Management: GA combined w/ Regional for post-op pain   Induction: Intravenous  Airway Management Planned: Oral ETT  Additional Equipment: None  Intra-op Plan:   Post-operative Plan: Extubation in OR  Informed Consent: I have reviewed the patients History and Physical, chart, labs and discussed the procedure including the risks, benefits and alternatives for the proposed anesthesia with the patient or authorized representative who has indicated his/her understanding and acceptance.   Dental advisory given  Plan Discussed with: CRNA, Anesthesiologist and Surgeon  Anesthesia Plan Comments:        Anesthesia Quick Evaluation
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
## 2015-10-08 NOTE — Anesthesia Procedure Notes (Signed)
Procedure Name: Intubation Date/Time: 10/08/2015 12:06 PM Performed by: Rogelia BogaMUELLER, THOMAS P Pre-anesthesia Checklist: Patient identified, Emergency Drugs available, Suction available, Patient being monitored and Timeout performed Patient Re-evaluated:Patient Re-evaluated prior to inductionOxygen Delivery Method: Circle system utilized Preoxygenation: Pre-oxygenation with 100% oxygen Intubation Type: IV induction Ventilation: Mask ventilation without difficulty Laryngoscope Size: Mac and 3 Grade View: Grade II Tube type: Oral Tube size: 7.5 mm Number of attempts: 1 Airway Equipment and Method: Stylet Placement Confirmation: ETT inserted through vocal cords under direct vision,  positive ETCO2 and breath sounds checked- equal and bilateral Secured at: 22 cm Tube secured with: Tape Dental Injury: Teeth and Oropharynx as per pre-operative assessment
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
## 2015-10-09 ENCOUNTER — Encounter (HOSPITAL_COMMUNITY): Payer: Self-pay | Admitting: *Deleted

## 2015-10-09 LAB — BASIC METABOLIC PANEL
Anion gap: 6 (ref 5–15)
BUN: 11 mg/dL (ref 6–20)
CO2: 27 mmol/L (ref 22–32)
Calcium: 7.7 mg/dL — ABNORMAL LOW (ref 8.9–10.3)
Chloride: 103 mmol/L (ref 101–111)
Creatinine, Ser: 0.93 mg/dL (ref 0.61–1.24)
GFR calc Af Amer: >60 mL/min (ref >60)
GFR calc non Af Amer: >60 mL/min (ref >60)
Glucose, Bld: 112 mg/dL — ABNORMAL HIGH (ref 65–99)
Potassium: 3.3 mmol/L — ABNORMAL LOW (ref 3.5–5.1)
Sodium: 136 mmol/L (ref 135–145)

## 2015-10-09 LAB — CBC
HCT: 30.7 % — ABNORMAL LOW (ref 39.0–52.0)
Hemoglobin: 10.1 g/dL — ABNORMAL LOW (ref 13.0–17.0)
MCH: 30 pg (ref 26.0–34.0)
MCHC: 32.9 g/dL (ref 30.0–36.0)
MCV: 91.1 fL (ref 78.0–100.0)
Platelets: 147 10*3/uL — ABNORMAL LOW (ref 150–400)
RBC: 3.37 MIL/uL — ABNORMAL LOW (ref 4.22–5.81)
RDW: 12.9 % (ref 11.5–15.5)
WBC: 11.1 10*3/uL — ABNORMAL HIGH (ref 4.0–10.5)

## 2015-10-09 MED ORDER — ACETAMINOPHEN 325 MG PO TABS
650.0000 mg | ORAL_TABLET | Freq: Four times a day (QID) | ORAL | Status: AC
  Administered 2015-10-09: 650 mg via ORAL
  Filled 2015-10-09: qty 2

## 2015-10-09 MED ORDER — POTASSIUM CHLORIDE CRYS ER 20 MEQ PO TBCR
20.0000 meq | EXTENDED_RELEASE_TABLET | Freq: Three times a day (TID) | ORAL | Status: AC
  Administered 2015-10-09 (×3): 20 meq via ORAL
  Filled 2015-10-09 (×3): qty 1

## 2015-10-09 NOTE — Progress Notes (Signed)
Or[facility] Tech Progress Note Patient Details:  Ronald Hayes 07/01/1985 [id number]  Patient ID: Ronald Hayes, male   DOB: 11/03/1985, 30 y.o.   MRN: [id number]   Saul FordyceJennifer C Gordan Grell 10/09/2015, 6:13 PMCalled [employer] for right Bledsoe brace.
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
## 2015-10-10 LAB — CBC
HCT: 26.2 % — ABNORMAL LOW (ref 39.0–52.0)
Hemoglobin: 8.6 g/dL — ABNORMAL LOW (ref 13.0–17.0)
MCH: 30.1 pg (ref 26.0–34.0)
MCHC: 32.8 g/dL (ref 30.0–36.0)
MCV: 91.6 fL (ref 78.0–100.0)
Platelets: 126 10*3/uL — ABNORMAL LOW (ref 150–400)
RBC: 2.86 MIL/uL — ABNORMAL LOW (ref 4.22–5.81)
RDW: 12.4 % (ref 11.5–15.5)
WBC: 9.7 10*3/uL (ref 4.0–10.5)

## 2015-10-10 LAB — BASIC METABOLIC PANEL
Anion gap: 5 (ref 5–15)
BUN: 11 mg/dL (ref 6–20)
CO2: 27 mmol/L (ref 22–32)
Calcium: 7.9 mg/dL — ABNORMAL LOW (ref 8.9–10.3)
Chloride: 105 mmol/L (ref 101–111)
Creatinine, Ser: 0.88 mg/dL (ref 0.61–1.24)
GFR calc Af Amer: >60 mL/min (ref >60)
GFR calc non Af Amer: >60 mL/min (ref >60)
Glucose, Bld: 114 mg/dL — ABNORMAL HIGH (ref 65–99)
Potassium: 3.7 mmol/L (ref 3.5–5.1)
Sodium: 137 mmol/L (ref 135–145)

## 2015-10-10 MED ORDER — CHLORHEXIDINE GLUCONATE 4 % EX LIQD: 60.0000 mL | Freq: Once | CUTANEOUS | Status: DC

## 2015-10-10 MED ORDER — CHLORHEXIDINE GLUCONATE 4 % EX LIQD
60.0000 mL | Freq: Once | CUTANEOUS | Status: AC
  Administered 2015-10-11: 4 via TOPICAL

## 2015-10-10 MED ORDER — LACTATED RINGERS IV SOLN: INTRAVENOUS | Status: DC

## 2015-10-10 MED ORDER — ACETAMINOPHEN 500 MG PO TABS
1000.0000 mg | ORAL_TABLET | Freq: Once | ORAL | Status: AC
  Administered 2015-10-10: 1000 mg via ORAL
  Filled 2015-10-10: qty 2

## 2015-10-10 MED ORDER — CEFAZOLIN SODIUM-DEXTROSE 2-4 GM/100ML-% IV SOLN
2.0000 g | INTRAVENOUS | Status: AC
  Administered 2015-10-11: 2 g via INTRAVENOUS
  Filled 2015-10-10 (×2): qty 100
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
## 2015-10-11 ENCOUNTER — Encounter (HOSPITAL_COMMUNITY)
Admission: EM | Disposition: A | Discharge status: Home Health | Payer: Self-pay | Source: Home / Self Care | Attending: Orthopedic Surgery

## 2015-10-11 ENCOUNTER — Inpatient Hospital Stay (HOSPITAL_COMMUNITY): Payer: BLUE CROSS/BLUE SHIELD | Admitting: Anesthesiology

## 2015-10-11 HISTORY — PX: ORIF WRIST FRACTURE: SHX2133

## 2015-10-11 HISTORY — PX: OPEN REDUCTION INTERNAL FIXATION (ORIF) FOOT LISFRANC FRACTURE: SHX5990

## 2015-10-11 LAB — CBC
HCT: 25.9 % — ABNORMAL LOW (ref 39.0–52.0)
Hemoglobin: 8.8 g/dL — ABNORMAL LOW (ref 13.0–17.0)
MCH: 30.1 pg (ref 26.0–34.0)
MCHC: 34 g/dL (ref 30.0–36.0)
MCV: 88.7 fL (ref 78.0–100.0)
Platelets: 147 10*3/uL — ABNORMAL LOW (ref 150–400)
RBC: 2.92 MIL/uL — ABNORMAL LOW (ref 4.22–5.81)
RDW: 11.9 % (ref 11.5–15.5)
WBC: 10.6 10*3/uL — ABNORMAL HIGH (ref 4.0–10.5)

## 2015-10-11 LAB — SURGICAL PCR SCREEN
MRSA, PCR: NEGATIVE
Staphylococcus aureus: NEGATIVE

## 2015-10-11 SURGERY — OPEN REDUCTION INTERNAL FIXATION (ORIF) FOOT LISFRANC FRACTURE
Anesthesia: General | Site: Wrist | Laterality: Right

## 2015-10-11 SURGERY — OPEN REDUCTION INTERNAL FIXATION (ORIF) FOOT LISFRANC FRACTURE
Anesthesia: General | Laterality: Right

## 2015-10-11 MED ORDER — ROCURONIUM BROMIDE 100 MG/10ML IV SOLN
INTRAVENOUS | Status: DC | PRN
  Administered 2015-10-11: 50 mg via INTRAVENOUS

## 2015-10-11 MED ORDER — MIDAZOLAM HCL 2 MG/2ML IJ SOLN
1.0000 mg | Freq: Once | INTRAMUSCULAR | Status: AC
  Administered 2015-10-11: 1 mg via INTRAVENOUS
  Filled 2015-10-11: qty 1

## 2015-10-11 MED ORDER — ONDANSETRON HCL 4 MG PO TABS: 4.0000 mg | ORAL_TABLET | Freq: Three times a day (TID) | ORAL | 0 refills | Status: DC | PRN

## 2015-10-11 MED ORDER — FENTANYL CITRATE (PF) 100 MCG/2ML IJ SOLN
INTRAMUSCULAR | Status: AC
  Filled 2015-10-11: qty 4

## 2015-10-11 MED ORDER — LIDOCAINE HCL (CARDIAC) 20 MG/ML IV SOLN
INTRAVENOUS | Status: DC | PRN
  Administered 2015-10-11: 60 mg via INTRAVENOUS

## 2015-10-11 MED ORDER — ACETAMINOPHEN 10 MG/ML IV SOLN
INTRAVENOUS | Status: AC
  Filled 2015-10-11: qty 100

## 2015-10-11 MED ORDER — LACTATED RINGERS IV SOLN
INTRAVENOUS | Status: DC
  Administered 2015-10-11: 17:00:00 via INTRAVENOUS

## 2015-10-11 MED ORDER — KETOROLAC TROMETHAMINE 15 MG/ML IJ SOLN
15.0000 mg | Freq: Four times a day (QID) | INTRAMUSCULAR | Status: AC
  Administered 2015-10-12 (×4): 15 mg via INTRAVENOUS
  Filled 2015-10-11 (×4): qty 1

## 2015-10-11 MED ORDER — PROPOFOL 10 MG/ML IV BOLUS
INTRAVENOUS | Status: DC | PRN
  Administered 2015-10-11: 200 mg via INTRAVENOUS

## 2015-10-11 MED ORDER — ASPIRIN EC 325 MG PO TBEC: 325.0000 mg | DELAYED_RELEASE_TABLET | Freq: Every day | ORAL | 0 refills | Status: DC

## 2015-10-11 MED ORDER — OXYCODONE-ACETAMINOPHEN 5-325 MG PO TABS: 1.0000 | ORAL_TABLET | ORAL | 0 refills | Status: DC | PRN

## 2015-10-11 MED ORDER — MIDAZOLAM HCL 2 MG/2ML IJ SOLN
INTRAMUSCULAR | Status: AC
  Filled 2015-10-11: qty 2

## 2015-10-11 MED ORDER — ONDANSETRON HCL 4 MG/2ML IJ SOLN
INTRAMUSCULAR | Status: DC | PRN
  Administered 2015-10-11: 4 mg via INTRAVENOUS

## 2015-10-11 MED ORDER — ACETAMINOPHEN 500 MG PO TABS
1000.0000 mg | ORAL_TABLET | Freq: Four times a day (QID) | ORAL | Status: AC
  Administered 2015-10-12 (×3): 1000 mg via ORAL
  Filled 2015-10-11 (×4): qty 2

## 2015-10-11 MED ORDER — PROPOFOL 10 MG/ML IV BOLUS
INTRAVENOUS | Status: AC
  Filled 2015-10-11: qty 20

## 2015-10-11 MED ORDER — BUPIVACAINE HCL (PF) 0.25 % IJ SOLN
INTRAMUSCULAR | Status: DC | PRN
  Administered 2015-10-11 (×2): 10 mL

## 2015-10-11 MED ORDER — 0.9 % SODIUM CHLORIDE (POUR BTL) OPTIME
TOPICAL | Status: DC | PRN
  Administered 2015-10-11: 1000 mL

## 2015-10-11 MED ORDER — MIDAZOLAM HCL 5 MG/5ML IJ SOLN
INTRAMUSCULAR | Status: DC | PRN
  Administered 2015-10-11: 2 mg via INTRAVENOUS

## 2015-10-11 MED ORDER — FENTANYL CITRATE (PF) 100 MCG/2ML IJ SOLN
INTRAMUSCULAR | Status: DC | PRN
  Administered 2015-10-11 (×2): 50 ug via INTRAVENOUS
  Administered 2015-10-11 (×3): 100 ug via INTRAVENOUS

## 2015-10-11 MED ORDER — METHOCARBAMOL 500 MG PO TABS: 500.0000 mg | ORAL_TABLET | Freq: Four times a day (QID) | ORAL | 0 refills | Status: DC | PRN

## 2015-10-11 MED ORDER — BUPIVACAINE-EPINEPHRINE (PF) 0.5% -1:200000 IJ SOLN
INTRAMUSCULAR | Status: DC | PRN
  Administered 2015-10-11: 20 mL via PERINEURAL

## 2015-10-11 MED ORDER — BUPIVACAINE HCL (PF) 0.25 % IJ SOLN
INTRAMUSCULAR | Status: AC
  Filled 2015-10-11: qty 30

## 2015-10-11 MED ORDER — DOCUSATE SODIUM 100 MG PO CAPS: 100.0000 mg | ORAL_CAPSULE | Freq: Two times a day (BID) | ORAL | 0 refills | Status: DC

## 2015-10-11 MED ORDER — FENTANYL CITRATE (PF) 100 MCG/2ML IJ SOLN
INTRAMUSCULAR | Status: AC
  Administered 2015-10-11: 50 ug via INTRAVENOUS
  Filled 2015-10-11: qty 2

## 2015-10-11 MED ORDER — ACETAMINOPHEN 10 MG/ML IV SOLN
INTRAVENOUS | Status: DC | PRN
  Administered 2015-10-11: 1000 mg via INTRAVENOUS

## 2015-10-11 MED ORDER — FENTANYL CITRATE (PF) 100 MCG/2ML IJ SOLN
50.0000 ug | Freq: Once | INTRAMUSCULAR | Status: AC
  Administered 2015-10-11: 50 ug via INTRAVENOUS

## 2015-10-11 MED ORDER — OMEPRAZOLE 20 MG PO CPDR: 20.0000 mg | DELAYED_RELEASE_CAPSULE | Freq: Every day | ORAL | 2 refills | Status: DC

## 2015-10-11 MED ORDER — PROMETHAZINE HCL 25 MG/ML IJ SOLN: 6.2500 mg | INTRAMUSCULAR | Status: DC | PRN

## 2015-10-11 MED ORDER — MIDAZOLAM HCL 2 MG/2ML IJ SOLN
INTRAMUSCULAR | Status: AC
  Administered 2015-10-11: 1 mg via INTRAVENOUS
  Filled 2015-10-11: qty 2

## 2015-10-11 MED ORDER — ARTIFICIAL TEARS OP OINT
TOPICAL_OINTMENT | OPHTHALMIC | Status: DC | PRN
  Administered 2015-10-11: 1 via OPHTHALMIC

## 2015-10-11 MED ORDER — DIPHENHYDRAMINE HCL 50 MG/ML IJ SOLN
INTRAMUSCULAR | Status: DC | PRN
  Administered 2015-10-11: 25 mg via INTRAVENOUS

## 2015-10-11 MED ORDER — SUGAMMADEX SODIUM 200 MG/2ML IV SOLN
INTRAVENOUS | Status: DC | PRN
  Administered 2015-10-11: 200 mg via INTRAVENOUS

## 2015-10-11 MED ORDER — CEFAZOLIN SODIUM-DEXTROSE 2-4 GM/100ML-% IV SOLN
2.0000 g | Freq: Four times a day (QID) | INTRAVENOUS | Status: AC
  Administered 2015-10-12 (×3): 2 g via INTRAVENOUS
  Filled 2015-10-11 (×3): qty 100

## 2015-10-11 MED ORDER — FENTANYL CITRATE (PF) 100 MCG/2ML IJ SOLN: 25.0000 ug | INTRAMUSCULAR | Status: DC | PRN

## 2015-10-11 SURGICAL SUPPLY — 89 items
BANDAGE ACE 4X5 VEL STRL LF (GAUZE/BANDAGES/DRESSINGS) ×3 IMPLANT
BANDAGE ACE 6X5 VEL STRL LF (GAUZE/BANDAGES/DRESSINGS) ×6 IMPLANT
BANDAGE ELASTIC 3 VELCRO ST LF (GAUZE/BANDAGES/DRESSINGS) ×3 IMPLANT
BANDAGE ELASTIC 4 VELCRO ST LF (GAUZE/BANDAGES/DRESSINGS) ×3 IMPLANT
BANDAGE ELASTIC 6 VELCRO ST LF (GAUZE/BANDAGES/DRESSINGS) ×3 IMPLANT
BANDAGE ESMARK 6X9 LF (GAUZE/BANDAGES/DRESSINGS) ×2 IMPLANT
BENZOIN TINCTURE PRP APPL 2/3 (GAUZE/BANDAGES/DRESSINGS) ×3 IMPLANT
BIT DRILL 2.2 SS TIBIAL (BIT) ×3 IMPLANT
BIT DRILL CANN 2.7 (BIT) ×1
BIT DRILL SRG 2.7XCANN AO CPLG (BIT) ×2 IMPLANT
BIT DRL SRG 2.7XCANN AO CPLNG (BIT) ×2
BLADE SURG ROTATE 9660 (MISCELLANEOUS) IMPLANT
BNDG COHESIVE 4X5 TAN STRL (GAUZE/BANDAGES/DRESSINGS) ×3 IMPLANT
BNDG ESMARK 4X9 LF (GAUZE/BANDAGES/DRESSINGS) ×3 IMPLANT
BNDG ESMARK 6X9 LF (GAUZE/BANDAGES/DRESSINGS) ×3
BNDG GAUZE ELAST 4 BULKY (GAUZE/BANDAGES/DRESSINGS) ×3 IMPLANT
CHLORAPREP W/TINT 26ML (MISCELLANEOUS) ×6 IMPLANT
CORDS BIPOLAR (ELECTRODE) ×3 IMPLANT
COVER SURGICAL LIGHT HANDLE (MISCELLANEOUS) ×6 IMPLANT
CUFF TOURNIQUET SINGLE 18IN (TOURNIQUET CUFF) ×3 IMPLANT
CUFF TOURNIQUET SINGLE 24IN (TOURNIQUET CUFF) IMPLANT
CUFF TOURNIQUET SINGLE 34IN LL (TOURNIQUET CUFF) ×3 IMPLANT
DECANTER SPIKE VIAL GLASS SM (MISCELLANEOUS) IMPLANT
DRAPE C-ARM 42X72 X-RAY (DRAPES) IMPLANT
DRAPE IMP U-DRAPE 54X76 (DRAPES) ×3 IMPLANT
DRAPE OEC MINIVIEW 54X84 (DRAPES) ×3 IMPLANT
DRAPE ORTHO SPLIT 77X108 STRL (DRAPES)
DRAPE SURG ORHT 6 SPLT 77X108 (DRAPES) IMPLANT
DRAPE U-SHAPE 47X51 STRL (DRAPES) ×3 IMPLANT
DRSG ADAPTIC 3X8 NADH LF (GAUZE/BANDAGES/DRESSINGS) ×3 IMPLANT
DRSG PAD ABDOMINAL 8X10 ST (GAUZE/BANDAGES/DRESSINGS) ×3 IMPLANT
DURAPREP 26ML APPLICATOR (WOUND CARE) IMPLANT
ELECT REM PT RETURN 9FT ADLT (ELECTROSURGICAL) ×3
ELECTRODE REM PT RTRN 9FT ADLT (ELECTROSURGICAL) ×2 IMPLANT
GAUZE SPONGE 4X4 12PLY STRL (GAUZE/BANDAGES/DRESSINGS) ×3 IMPLANT
GAUZE XEROFORM 1X8 LF (GAUZE/BANDAGES/DRESSINGS) ×3 IMPLANT
GAUZE XEROFORM 5X9 LF (GAUZE/BANDAGES/DRESSINGS) ×3 IMPLANT
GLOVE BIO SURGEON STRL SZ7.5 (GLOVE) ×6 IMPLANT
GLOVE BIOGEL PI IND STRL 8 (GLOVE) ×4 IMPLANT
GLOVE BIOGEL PI INDICATOR 8 (GLOVE) ×2
GOWN STRL REUS W/ TWL LRG LVL3 (GOWN DISPOSABLE) ×6 IMPLANT
GOWN STRL REUS W/ TWL XL LVL3 (GOWN DISPOSABLE) ×2 IMPLANT
GOWN STRL REUS W/TWL LRG LVL3 (GOWN DISPOSABLE) ×3
GOWN STRL REUS W/TWL XL LVL3 (GOWN DISPOSABLE) ×1
K-WIRE 1.6 (WIRE) ×2
K-WIRE FX5X1.6XNS BN SS (WIRE) ×4
K-WIRE ORTHOPEDIC 1.4X150L (WIRE) ×6
KIT BASIN OR (CUSTOM PROCEDURE TRAY) ×3 IMPLANT
KIT ROOM TURNOVER OR (KITS) ×3 IMPLANT
KWIRE FX5X1.6XNS BN SS (WIRE) ×4 IMPLANT
KWIRE ORTHOPEDIC 1.4X150L (WIRE) ×4 IMPLANT
MANIFOLD NEPTUNE II (INSTRUMENTS) ×3 IMPLANT
NEEDLE HYPO 25GX1X1/2 BEV (NEEDLE) IMPLANT
NS IRRIG 1000ML POUR BTL (IV SOLUTION) ×6 IMPLANT
PACK ORTHO EXTREMITY (CUSTOM PROCEDURE TRAY) ×3 IMPLANT
PAD ARMBOARD 7.5X6 YLW CONV (MISCELLANEOUS) ×6 IMPLANT
PAD CAST 4YDX4 CTTN HI CHSV (CAST SUPPLIES) ×2 IMPLANT
PADDING CAST COTTON 4X4 STRL (CAST SUPPLIES) ×1
PADDING CAST COTTON 6X4 STRL (CAST SUPPLIES) ×3 IMPLANT
PEG LOCKING SMOOTH 2.2X18 (Peg) ×3 IMPLANT
PEG LOCKING SMOOTH 2.2X20 (Screw) ×15 IMPLANT
PEG LOCKING SMOOTH 2.2X22 (Screw) ×3 IMPLANT
PLATE RIGHT VOLAR RIM DVR (Plate) ×3 IMPLANT
SCREW BONE CANN 4.0X42MM (Screw) ×3 IMPLANT
SCREW CANN PT RVRS CUT FLT 34X (Screw) ×2 IMPLANT
SCREW CANNULATED 4.0X34MM (Screw) ×1 IMPLANT
SCREW CANNULATED 4.0X38MM (Screw) ×3 IMPLANT
SCREW LOCK 16X2.7X 3 LD TPR (Screw) ×4 IMPLANT
SCREW LOCKING 2.7X16 (Screw) ×2 IMPLANT
SPLINT PLASTER CAST XFAST 5X30 (CAST SUPPLIES) ×4 IMPLANT
SPLINT PLASTER XFAST SET 5X30 (CAST SUPPLIES) ×2
SPONGE LAP 18X18 X RAY DECT (DISPOSABLE) ×3 IMPLANT
SPONGE LAP 4X18 X RAY DECT (DISPOSABLE) ×3 IMPLANT
STRIP CLOSURE SKIN 1/2X4 (GAUZE/BANDAGES/DRESSINGS) ×3 IMPLANT
SUCTION FRAZIER HANDLE 10FR (MISCELLANEOUS) ×1
SUCTION TUBE FRAZIER 10FR DISP (MISCELLANEOUS) ×2 IMPLANT
SUT ETHILON 3 0 PS 1 (SUTURE) ×3 IMPLANT
SUT MNCRL AB 4-0 PS2 18 (SUTURE) ×3 IMPLANT
SUT MON AB 2-0 CT1 27 (SUTURE) ×6 IMPLANT
SUT MON AB 2-0 CT1 36 (SUTURE) ×3 IMPLANT
SYR BULB IRRIGATION 50ML (SYRINGE) ×3 IMPLANT
SYR CONTROL 10ML LL (SYRINGE) IMPLANT
TOWEL OR 17X24 6PK STRL BLUE (TOWEL DISPOSABLE) ×3 IMPLANT
TOWEL OR 17X26 10 PK STRL BLUE (TOWEL DISPOSABLE) ×3 IMPLANT
TOWEL OR NON WOVEN STRL DISP B (DISPOSABLE) ×3 IMPLANT
TUBE CONNECTING 12X1/4 (SUCTIONS) ×3 IMPLANT
UNDERPAD 30X30 (UNDERPADS AND DIAPERS) ×3 IMPLANT
WATER STERILE IRR 1000ML POUR (IV SOLUTION) ×6 IMPLANT
YANKAUER SUCT BULB TIP NO VENT (SUCTIONS) IMPLANT

## 2015-10-11 NOTE — Transfer of Care (Signed)
Immediate Anesthesia Transfer of Care Note  Patient: Ronald CellarBarry D Hayes  Procedure(s) Performed: Procedure(s): OPEN REDUCTION INTERNAL FIXATION (ORIF) RIGHT FOOT LISFRANC FRACTURE (Right) OPEN REDUCTION INTERNAL FIXATION (ORIF) RIGHT WRIST FRACTURE (Right)  Patient Location: PACU  Anesthesia Type:General  Level of Consciousness: awake  Airway & Oxygen Therapy: Patient Spontanous Breathing  Post-op Assessment: Report given to RN and Post -op Vital signs reviewed and stable  Post vital signs: Reviewed and stable  Last Vitals:  Vitals:   10/11/15 1845 10/11/15 1900  BP:    Pulse: (!) 112 (!) 119  Resp: (!) 24 20  Temp:      Last Pain:  Vitals:   10/11/15 1810  TempSrc:   PainSc: 4       Patients Stated Pain Goal: 4 (10/11/15 1559)  Complications: No apparent anesthesia complications

## 2015-10-11 NOTE — Progress Notes (Signed)
[facility]. Pt transferred to preop holding without incident per MD order via hospital bed accompanied by family. Prior to leaving unit, pt repts pain "tolerable" at this time.

## 2015-10-11 NOTE — Anesthesia Preprocedure Evaluation (Addendum)
Anesthesia Evaluation  Patient identified by MRN, date of birth, ID band Patient awake    Reviewed: Allergy & Precautions, NPO status , Patient's Chart, lab work & pertinent test results  History of Anesthesia Complications Negative for: history of anesthetic complications  Airway Mallampati: II  TM Distance: >3 FB Neck ROM: Full    Dental  (+) Teeth Intact, Dental Advisory Given   Pulmonary neg pulmonary ROS,    breath sounds clear to auscultation       Cardiovascular Exercise Tolerance: Good negative cardio ROS   Rhythm:Regular Rate:Normal     Neuro/Psych negative neurological ROS  negative psych ROS   GI/Hepatic negative GI ROS, Neg liver ROS,   Endo/Other  Obesity   Renal/GU negative Renal ROS     Musculoskeletal negative musculoskeletal ROS (+)   Abdominal   Peds  Hematology negative hematology ROS (+) Blood dyscrasia, anemia , Thrombocytopenia    Anesthesia Other Findings Day of surgery medications reviewed with the patient.  Reproductive/Obstetrics                           Anesthesia Physical  Anesthesia Plan  ASA: II  Anesthesia Plan: General and Regional   Post-op Pain Management: GA combined w/ Regional for post-op pain   Induction: Intravenous  Airway Management Planned: Oral ETT  Additional Equipment: None  Intra-op Plan:   Post-operative Plan: Extubation in OR  Informed Consent: I have reviewed the patients History and Physical, chart, labs and discussed the procedure including the risks, benefits and alternatives for the proposed anesthesia with the patient or authorized representative who has indicated his/her understanding and acceptance.   Dental advisory given  Plan Discussed with: CRNA, Anesthesiologist and Surgeon  Anesthesia Plan Comments: (Risks/benefits of general anesthesia discussed with patient including risk of damage to teeth, lips, gum, and  tongue, nausea/vomiting, allergic reactions to medications, and the possibility of heart attack, stroke and death.  All patient questions answered.  Patient wishes to proceed.  Discussed risks and benefits of supraclavicular nerve block including failure, bleeding, infection, nerve damage, weakness, shortness of breath, pneumothorax. Questions answered. Patient consents to block. )      Anesthesia Quick Evaluation

## 2015-10-11 NOTE — Progress Notes (Signed)
Report given to Josh, CRNA.  

## 2015-10-11 NOTE — Anesthesia Procedure Notes (Signed)
Anesthesia Regional Block:  Supraclavicular block  Pre-Anesthetic Checklist: ,, timeout performed, Correct Patient, Correct Site, Correct Laterality, Correct Procedure, Correct Position, site marked, Risks and benefits discussed,  Surgical consent,  Pre-op evaluation,  At surgeon's request and post-op pain management  Laterality: Right  Prep: chloraprep       Needles:  Injection technique: Single-shot  Needle Type: Echogenic Stimulator Needle     Needle Length: 9cm 9 cm Needle Gauge: 22 and 22 G    Additional Needles:  Procedures: ultrasound guided (picture in chart) Supraclavicular block Narrative:  Injection made incrementally with aspirations every 5 mL.  Performed by: Personally  Anesthesiologist: TURK, STEPHEN EDWARD  Additional Notes: Functioning IV was confirmed and monitors were applied.  A 90mm 22ga Arrow echogenic stimulator needle was used. Sterile prep and drape, hand hygiene, and sterile gloves were used.  Negative aspiration and negative test dose prior to incremental administration of local anesthetic. The patient tolerated the procedure well.  Ultrasound guidance: relevent anatomy identified, needle position confirmed, local anesthetic spread visualized around nerve(s), vascular puncture avoided.  Image printed for medical record.       

## 2015-10-11 NOTE — Anesthesia Procedure Notes (Signed)
Procedure Name: Intubation Date/Time: 10/11/2015 7:47 AM Performed by: Tillman AbideHAWKINS, JOSHUA B Pre-anesthesia Checklist: Patient identified, Emergency Drugs available, Suction available and Patient being monitored Patient Re-evaluated:Patient Re-evaluated prior to inductionOxygen Delivery Method: Circle System Utilized Preoxygenation: Pre-oxygenation with 100% oxygen Intubation Type: IV induction Ventilation: Mask ventilation without difficulty Grade View: Grade III Tube type: Oral Tube size: 7.5 mm Number of attempts: 1 Airway Equipment and Method: Stylet Placement Confirmation: ETT inserted through vocal cords under direct vision,  positive ETCO2 and breath sounds checked- equal and bilateral Secured at: 23 cm Tube secured with: Tape Dental Injury: Teeth and Oropharynx as per pre-operative assessment  Difficulty Due To: Difficulty was unanticipated

## 2015-10-11 NOTE — H&P (View-Only) (Signed)
   Assessment: 2 Days Post-Op  S/P Procedure(s) (LRB): INTRAMEDULLARY (IM) NAIL FEMORAL (Right) OPEN REDUCTION INTERNAL (ORIF) FIXATION PATELLA (Right) by Dr. Jewel Baizeimothy D. Eulah PontMurphy on 10/08/15  Principal Problem:   Femur fracture, right (HCC) Active Problems:   Closed volar Barton's fracture of right radius   Comminuted fracture of right patella, open type I or II, initial encounter   Lisfranc dislocation, right, initial encounter ADDITIONAL DIAGNOSIS:  Acute Blood Loss Anemia, likely w/ dilutional component.  Hgb down to 8.6 < 10.1  AFVSN.  Doing well.  Pain control good.  Plan: To OR tomorrow to repair lisfranc - Right Foot. Non-op treatment of right wrist. Up with therapy Decrease fluids.  KVO when tolerated adequate amount Elevate Right leg / apply ice Follow labs  Weight Bearing: Non Weight Bearing (NWB) Right Leg / foot - Must be in immobilizer full time. WBAT above elbow Right Upper Extremity  Dressings: Reinforce PRN.  VTE prophylaxis: Lovenox, SCDs, ambulation Dispo: Plan for ORIF Right foot on Tuesday.  Pt recommends HHPT.  Subjective: Patient reports pain as moderate. Pain controlled with PO meds.  Tolerating diet.  Urinating.  +Flatus.  No CP, SOB.  Not yet OOB.  Objective:   VITALS:   Vitals:   10/09/15 0433 10/09/15 1502 10/09/15 2009 10/10/15 0509  BP: (!) 113/57 112/62 (!) 127/52 118/72  Pulse: 88 92 (!) 105 97  Resp: 16 16 16 16   Temp: 98.8 F (37.1 C) 98.8 F (37.1 C) (!) 102.2 F (39 C) 99.3 F (37.4 C)  TempSrc: Oral Oral Oral Oral  SpO2: 99% 99% 98% 96%  Weight:      Height:       CBC Latest Ref Rng & Units 10/10/2015 10/09/2015 10/08/2015  WBC 4.0 - 10.5 K/uL 9.7 11.1(H) 12.3(H)  Hemoglobin 13.0 - 17.0 g/dL 9.1(Y8.6(L) 10.1(L) 14.0  Hematocrit 39.0 - 52.0 % 26.2(L) 30.7(L) 42.0  Platelets 150 - 400 K/uL 126(L) 147(L) 214   BMP Latest Ref Rng & Units 10/10/2015 10/09/2015 10/08/2015  Glucose 65 - 99 mg/dL 782(N114(H) 562(Z112(H) 308(M147(H)  BUN 6 - 20 mg/dL 11 11  18   Creatinine 0.61 - 1.24 mg/dL 5.780.88 4.690.93 6.290.93  Sodium 135 - 145 mmol/L 137 136 138  Potassium 3.5 - 5.1 mmol/L 3.7 3.3(L) 3.2(L)  Chloride 101 - 111 mmol/L 105 103 106  CO2 22 - 32 mmol/L 27 27 26   Calcium 8.9 - 10.3 mg/dL 7.9(L) 7.7(L) 8.6(L)   Intake/Output      10/08 0701 - 10/09 0700   I.V. (mL/kg) 1100 (10.6)   Total Intake(mL/kg) 1100 (10.6)   Urine (mL/kg/hr) 300 (0.1)   Total Output 300   Net +800       Urine Occurrence 3 x     Physical Exam: General: NAD.  Supine in bed.  Pleasant, conversant.  Father at bedside. Resp: No increased wob Cardio: regular rate and rhythm ABD soft Neurologically intact MSK Right arm splinted, hand warm. Sensation intact.  Neurovascularly intact Right leg in immobilizer.  Foot warm, Splinted.  EHL/FHL intact.  Sensation intact distally. Incision: dressings C/D/I   Ronald Hayes 10/10/2015, 6:53 AM

## 2015-10-11 NOTE — Progress Notes (Signed)
Continue to monitor HR/ BP. Patient alert and oriented x 4. Sinus tach on monitor. Patient repositioned for comfort. Pain controlled.

## 2015-10-11 NOTE — Progress Notes (Signed)
PT Cancellation Note  Patient Details Name: Ronald CellarBarry D Mangrum MRN: [id number] DOB: 09/20/1985   Cancelled Treatment:    Reason Eval/Treat Not Completed: Patient at procedure or test/unavailable (Pt to go to OR at 12 pm for repair of R lisfranc injury.  Will f/u when patient is able.  )   Florestine AversAimee J Rogers 10/11/2015, 11:34 AM [name], PTA pager [phone]

## 2015-10-11 NOTE — Op Note (Signed)
10/08/2015 - 10/11/2015  9:46 PM  PATIENT:  Ronald Hayes    PRE-OPERATIVE DIAGNOSIS:  LISFRANC FRACTURE RIGHT FOOT  POST-OPERATIVE DIAGNOSIS:  Same  PROCEDURE:  OPEN REDUCTION INTERNAL FIXATION (ORIF) FOOT LISFRANC FRACTURE, OPEN REDUCTION INTERNAL FIXATION (ORIF) WRIST FRACTURE  SURGEON:  MURPHY, Jewel Baize, MD  ASSISTANT: Aquilla Hacker, PA-C, he was present and scrubbed throughout the case, critical for completion in a timely fashion, and for retraction, instrumentation, and closure.   ANESTHESIA:   gen  PREOPERATIVE INDICATIONS:  Ronald Hayes is a  30 y.o. male with a diagnosis of LISFRANC FRACTURE RIGHT FOOT who failed conservative measures and elected for surgical management.    The risks benefits and alternatives were discussed with the patient preoperatively including but not limited to the risks of infection, bleeding, nerve injury, cardiopulmonary complications, the need for revision surgery, among others, and the patient was willing to proceed.  OPERATIVE IMPLANTS: biomet DVR plate, stryker can screws  OPERATIVE FINDINGS: unstable carpus, unstable 1st TMT joint  BLOOD LOSS: min  COMPLICATIONS: none  TOURNIQUET TIME: 60 arm, 45 foot  OPERATIVE PROCEDURE:  Patient was identified in the preoperative holding area and site was marked by me He was transported to the operating theater and placed on the table in supine position taking care to pad all bony prominences. After a preincinduction time out anesthesia was induced. The R lower and R upper extremity was prepped and draped in normal sterile fashion and a pre-incision timeout was performed. He received ancef for preoperative antibiotics.   Site of his right upper extremity while keeping the left lower extremity covered  I made a volar approach of Sherilyn Cooter to his distal radius protecting the neurovascular structures.  I elevated his pronator muscle off the radial side of the radius identified his volar lip fractures  as well as his radial styloid fracture. These were unstable with carpal mobility.  I then selected a standard distal radius distal plate. I performed a reduction of his distal radius. I then pinned the plate in the place I took multiple x-rays.  After moving the plate I retook fluoroscopy and was very happy with the placement. I then placed a shaft screw this effectively fixed the volar lip pieces into place and held well I placed a second shaft screw to reduce this further.  I left a K wire in the radial styloid piece I then placed distal pegs including the radial styloid PEG as happy with the reduction and placement of all this hardware I placed a third shaft screw and removed all K wires. I took multiple x-rays and was happy with the alignment of fracture reduction and all hardware.  Next I turned my attention to his right lower extremity.  I made a dorsal incision over his Lisfranc and first TMT joint. Prior this I performed an x-ray exam he was very unstable in his Lisfranc joint as well as his first TMT. Identified his second and third distal metatarsal fractures and these were well aligned.  I dissected down protecting all neurovascular structures and tendons to his first TMT joint again I stressed that it was unstable. I placed a K wire across it followed by a fully threaded cannulated screw. As happy with the placement on multiple fluoroscopic x-rays.  Next I placed a clamp across the Lisfranc joint reducing it into place I remove some bony fragmentation from within the joint the left bone attached to the ligament for good healing and good reduction.  I then  placed a K wire across this joint and placed a partially threaded cannulated screw I intentionally placed this through the far cortex of the second metatarsal for good purchase.  I took multiple fluoroscopic x-rays as happy with the stability of his first TMT Lisfranc joint. I thoroughly irrigated his incisions the radius and artery  been closed in layers and dressing and then applied I closed the foot in layers a dressing was applied and then splinted the distal radius I confirm appropriate stability and alignment of his second and third metatarsal fracture and placed splints across this as well.   He also had a lateral cuneiform fracture that was well aligned and stable. The splint held this stable as well   He was awoken and taken the PACU in stable condition  POST OPERATIVE PLAN: Nonweightbearing right lower extremity mobilized for DVT prophylaxis nonweightbearing right wrist weightbearing as tolerated right elbow

## 2015-10-11 NOTE — Progress Notes (Signed)
Removed IV from LFA d/t infiltration. Patient has one additional IV site in left FA

## 2015-10-11 NOTE — Interval H&P Note (Signed)
History and Physical Interval Note:  10/11/2015 3:53 PM  Ronald Hayes  has presented today for surgery, with the diagnosis of LISFRANC FRACTURE RIGHT FOOT  The various methods of treatment have been discussed with the patient and family. After consideration of risks, benefits and other options for treatment, the patient has consented to  Procedure(s): OPEN REDUCTION INTERNAL FIXATION (ORIF) FOOT LISFRANC FRACTURE (Right) OPEN REDUCTION INTERNAL FIXATION (ORIF) WRIST FRACTURE (Right) as a surgical intervention .  The patient's history has been reviewed, patient examined, no change in status, stable for surgery.  I have reviewed the patient's chart and labs.  Questions were answered to the patient's satisfaction.     MURPHY, TIMOTHY D

## 2015-10-12 ENCOUNTER — Encounter (HOSPITAL_COMMUNITY): Payer: Self-pay | Admitting: General Practice

## 2015-10-12 LAB — POCT I-STAT 4, (NA,K, GLUC, HGB,HCT)
Glucose, Bld: 111 mg/dL — ABNORMAL HIGH (ref 65–99)
HCT: 25 % — ABNORMAL LOW (ref 39.0–52.0)
Hemoglobin: 8.5 g/dL — ABNORMAL LOW (ref 13.0–17.0)
Potassium: 3.5 mmol/L (ref 3.5–5.1)
Sodium: 134 mmol/L — ABNORMAL LOW (ref 135–145)
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
## 2015-10-12 NOTE — Progress Notes (Signed)
   Assessment: 4 Days Post-Op  S/P Procedure(s) (LRB): INTRAMEDULLARY (IM) NAIL FEMORAL (Right) OPEN REDUCTION INTERNAL (ORIF) FIXATION PATELLA (Right) 1 Day Post-Op  S/P Procedure(s) (LRB): OPEN REDUCTION INTERNAL FIXATION (ORIF) RIGHT FOOT LISFRANC FRACTURE (Right) OPEN REDUCTION INTERNAL FIXATION (ORIF) RIGHT WRIST FRACTURE (Right) by Dr. Jewel Baizeimothy D. Murphy on 10/08/15 and 10/11/15  Principal Problem:   Femur fracture, right (HCC) Active Problems:   Closed volar Barton's fracture of right radius   Comminuted fracture of right patella, open type I or II, initial encounter   Lisfranc dislocation, right, initial encounter ADDITIONAL DIAGNOSIS:  Acute Blood Loss Anemia, likely w/ dilutional component.  Hgb stable  Doing well.  Pain control good.    Plan: Up with therapy Continue Fluids - KVO when tolerated adequate amount Elevate Right leg / apply ice Follow labs  Weight Bearing: Non Weight Bearing (NWB) Right Leg / foot - Must be elevated in immobilizer full time. WBAT through elbow Right Upper Extremity  Dressings: Reinforce PRN.  VTE prophylaxis: Lovenox, SCDs, ambulation.  Plan for ASA 325 mg x 30 days after discharge. Dispo: Home with HHPT - pending PT eval in 0-2 days  Subjective: Patient reports pain as moderate and controlled with PO meds.  Foot pain improved from pre-op.  Tolerating liquids.  Urinating.  +Flatus.  No CP, SOB.  Not yet OOB.  Objective:   VITALS:   Vitals:   10/11/15 2245 10/11/15 2300 10/11/15 2321 10/12/15 0400  BP: 135/77 (!) 142/80 134/63 (!) 130/55  Pulse: (!) 120 (!) 121 (!) 110 (!) 103  Resp: 11 12 15 16   Temp:  99.7 F (37.6 C) 100 F (37.8 C) 99.6 F (37.6 C)  TempSrc:   Oral Oral  SpO2: 95% 96% 95% 100%  Weight:      Height:       CBC Latest Ref Rng & Units 10/11/2015 10/10/2015 10/09/2015  WBC 4.0 - 10.5 K/uL 10.6(H) 9.7 11.1(H)  Hemoglobin 13.0 - 17.0 g/dL 1.6(X8.8(L) 0.9(U8.6(L) 10.1(L)  Hematocrit 39.0 - 52.0 % 25.9(L) 26.2(L) 30.7(L)    Platelets 150 - 400 K/uL 147(L) 126(L) 147(L)   BMP Latest Ref Rng & Units 10/10/2015 10/09/2015 10/08/2015  Glucose 65 - 99 mg/dL 045(W114(H) 098(J112(H) 191(Y147(H)  BUN 6 - 20 mg/dL 11 11 18   Creatinine 0.61 - 1.24 mg/dL 7.820.88 9.560.93 2.130.93  Sodium 135 - 145 mmol/L 137 136 138  Potassium 3.5 - 5.1 mmol/L 3.7 3.3(L) 3.2(L)  Chloride 101 - 111 mmol/L 105 103 106  CO2 22 - 32 mmol/L 27 27 26   Calcium 8.9 - 10.3 mg/dL 7.9(L) 7.7(L) 8.6(L)   Intake/Output      10/10 0701 - 10/11 0700   P.O. 240   I.V. (mL/kg) 1800 (17.4)   Total Intake(mL/kg) 2040 (19.7)   Urine (mL/kg/hr) 650 (0.3)   Blood 50 (0)   Total Output 700   Net +1340        Physical Exam: General: NAD.  Supine in bed.  Pleasant, conversant.  Resp: No increased wob Cardio: regular rate and rhythm ABD soft Neurologically intact MSK Right arm splinted, hand warm. Sensation intact.  Neurovascularly intact Right leg in immobilizer.  Foot warm, Splinted.  EHL/FHL intact.  Sensation intact distally. Incision: dressings C/D/I   Albina BilletHenry Calvin Martensen III 10/12/2015, 6:52 AM
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
## 2015-10-13 MED ORDER — ACETAMINOPHEN 325 MG PO TABS
650.0000 mg | ORAL_TABLET | Freq: Four times a day (QID) | ORAL | Status: DC
  Administered 2015-10-13 – 2015-10-15 (×7): 650 mg via ORAL
  Filled 2015-10-13 (×9): qty 2

## 2015-10-13 MED ORDER — OXYCODONE HCL 5 MG PO TABS
5.0000 mg | ORAL_TABLET | ORAL | Status: DC | PRN
  Administered 2015-10-13 (×3): 10 mg via ORAL
  Administered 2015-10-14: 5 mg via ORAL
  Administered 2015-10-14 (×5): 10 mg via ORAL
  Administered 2015-10-15: 5 mg via ORAL
  Filled 2015-10-13 (×8): qty 2
  Filled 2015-10-13 (×2): qty 1

## 2015-10-13 NOTE — Anesthesia Postprocedure Evaluation (Signed)
Anesthesia Post Note  Patient: Ronald CellarBarry D Hayes  Procedure(s) Performed: Procedure(s) (LRB): OPEN REDUCTION INTERNAL FIXATION (ORIF) RIGHT FOOT LISFRANC FRACTURE (Right) OPEN REDUCTION INTERNAL FIXATION (ORIF) RIGHT WRIST FRACTURE (Right)  Patient location during evaluation: PACU Anesthesia Type: General Level of consciousness: awake and alert and patient cooperative Pain management: pain level controlled Vital Signs Assessment: post-procedure vital signs reviewed and stable Respiratory status: spontaneous breathing and respiratory function stable Cardiovascular status: stable Anesthetic complications: no    Last Vitals:  Vitals:   10/13/15 0034 10/13/15 0522  BP: (!) 109/55 122/61  Pulse: 85 100  Resp: 16 16  Temp: 37 C 37.7 C    Last Pain:  Vitals:   10/13/15 0629  TempSrc:   PainSc: 4                  HODIERNE,ADAM S
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
## 2015-10-14 LAB — CBC
HCT: 22.6 % — ABNORMAL LOW (ref 39.0–52.0)
Hemoglobin: 7.6 g/dL — ABNORMAL LOW (ref 13.0–17.0)
MCH: 29.8 pg (ref 26.0–34.0)
MCHC: 33.6 g/dL (ref 30.0–36.0)
MCV: 88.6 fL (ref 78.0–100.0)
Platelets: 265 10*3/uL (ref 150–400)
RBC: 2.55 MIL/uL — ABNORMAL LOW (ref 4.22–5.81)
RDW: 12.2 % (ref 11.5–15.5)
WBC: 8.2 10*3/uL (ref 4.0–10.5)

## 2015-10-14 LAB — HEMOGLOBIN AND HEMATOCRIT, BLOOD
HCT: 28.9 % — ABNORMAL LOW (ref 39.0–52.0)
Hemoglobin: 9.7 g/dL — ABNORMAL LOW (ref 13.0–17.0)

## 2015-10-14 LAB — PREPARE RBC (CROSSMATCH)

## 2015-10-14 MED ORDER — SODIUM CHLORIDE 0.9 % IV SOLN
Freq: Once | INTRAVENOUS | Status: AC
  Administered 2015-10-14: 18:00:00 via INTRAVENOUS

## 2015-10-14 MED ORDER — POLYETHYLENE GLYCOL 3350 17 G PO PACK
17.0000 g | PACK | Freq: Two times a day (BID) | ORAL | Status: DC
  Administered 2015-10-14 – 2015-10-15 (×3): 17 g via ORAL
  Filled 2015-10-14 (×3): qty 1

## 2015-10-14 NOTE — Progress Notes (Signed)
OT Cancellation Note  Patient Details Name: Ronald Hayes MRN: [id number] DOB: 03/06/1985   Cancelled Treatment:    Reason Eval/Treat Not Completed: Medical issues which prohibited therapy. Discussed status with RN and pt with low hemoglobin and awaiting transfusion. Will check back as able.  [address] DriveCharity A Byrum, OTR/L [phone] 10/14/2015, 11:45 AM

## 2015-10-14 NOTE — Progress Notes (Signed)
PT Cancellation Note  Patient Details Name: Ronald Hayes MRN: [id number] DOB: 12/07/1985   Cancelled Treatment:    Reason Eval/Treat Not Completed: Medical issues which prohibited therapy (Pt received unit of blood and adamant to receive second unit and try PT in am.  Pt reports," I don't want to get dizzy again.")   [name] 10/14/2015, 3:26 PM [name], PTA pager [phone]
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
## 2015-10-14 NOTE — Progress Notes (Signed)
PT Cancellation Note  Patient Details Name: Ronald Hayes MRN: [id number] DOB: 11/04/1985   Cancelled Treatment:    Reason Eval/Treat Not Completed: Medical issues which prohibited therapy (HGB 7.6.  Pt symptomatic from previous afternoon session will wait for patient to receive 1st unit of blood then return for therapy if time permits.  )   [name] 10/14/2015, 12:21 PM [name], PTA pager [phone]

## 2015-10-15 LAB — CREATININE, SERUM
Creatinine, Ser: 0.78 mg/dL (ref 0.61–1.24)
GFR calc Af Amer: >60 mL/min (ref >60)
GFR calc non Af Amer: >60 mL/min (ref >60)

## 2015-10-15 LAB — TYPE AND SCREEN
ABO/RH(D): O POS
Antibody Screen: NEGATIVE
Unit division: 0
Unit division: 0

## 2015-10-15 LAB — CBC
HCT: 28.8 % — ABNORMAL LOW (ref 39.0–52.0)
Hemoglobin: 9.5 g/dL — ABNORMAL LOW (ref 13.0–17.0)
MCH: 28.7 pg (ref 26.0–34.0)
MCHC: 33 g/dL (ref 30.0–36.0)
MCV: 87 fL (ref 78.0–100.0)
Platelets: 292 10*3/uL (ref 150–400)
RBC: 3.31 MIL/uL — ABNORMAL LOW (ref 4.22–5.81)
RDW: 13.8 % (ref 11.5–15.5)
WBC: 9.1 10*3/uL (ref 4.0–10.5)

## 2015-10-15 NOTE — Therapy (Signed)
Physical therapy attempted to see the patient. Per patient he had recently gotten up and went to the bathroom. He has not been seen since 10/12 but feels he is moving better then he was before receiving blood. Therapy will attempt to see the patient again if time permits later in the afternoon unless the patient is discharged.   Lorayne Benderavid Carroll PT DPT
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
## 2015-10-15 NOTE — Progress Notes (Signed)
This RN provided discharge instructions to pt and his parents. All questions answered and pt is ready to discharge via ambulance. Pt had his prescriptions per PA already.
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
## 2015-10-15 NOTE — Progress Notes (Signed)
Pt had a good amount of BM and feel much relieved. This RN is working on discharging pt.
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
## 2015-10-17 ENCOUNTER — Encounter (HOSPITAL_COMMUNITY): Payer: Self-pay | Admitting: Emergency Medicine

## 2015-10-18 DIAGNOSIS — D62 Acute posthemorrhagic anemia: Secondary | ICD-10-CM
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
## 2015-10-26 NOTE — Addendum Note (Signed)
Addendum  created 10/26/15 1430 by Val Eaglehristopher Moser, MD   Anesthesia Intra Blocks edited, Sign clinical note
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
## 2020-01-21 ENCOUNTER — Ambulatory Visit
Admission: EM | Admit: 2020-01-21 | Discharge: 2020-01-21 | Disposition: A | Discharge status: Home / Self Care | Payer: 59 | Attending: Emergency Medicine | Admitting: Emergency Medicine

## 2020-01-21 ENCOUNTER — Other Ambulatory Visit: Payer: Self-pay

## 2020-01-21 DIAGNOSIS — R509 Fever, unspecified: Secondary | ICD-10-CM

## 2020-01-21 DIAGNOSIS — Z20822 Contact with and (suspected) exposure to covid-19: Secondary | ICD-10-CM | POA: Diagnosis not present

## 2020-01-21 MED ORDER — CETIRIZINE HCL 10 MG PO TABS: 10.0000 mg | ORAL_TABLET | Freq: Every day | ORAL | 0 refills | Status: AC

## 2020-01-21 MED ORDER — ONDANSETRON 4 MG PO TBDP: 4.0000 mg | ORAL_TABLET | Freq: Three times a day (TID) | ORAL | 0 refills | Status: AC | PRN

## 2020-01-21 MED ORDER — FLUTICASONE PROPIONATE 50 MCG/ACT NA SUSP: 1.0000 | Freq: Every day | NASAL | 0 refills | Status: AC

## 2020-01-21 NOTE — Discharge Instructions (Signed)
Pain medication:  350 mg-1000 mg of Tylenol (acetaminophen) and/or 200 mg - 800 mg of Advil (ibuprofen, Motrin) every 8 hours as needed.  May alternate between the two throughout the day as they are generally safe to take together.  DO NOT exceed more than 3000 mg of Tylenol or 3200 mg of ibuprofen in a 24 hour period as this could damage your stomach, kidneys, liver, or increase your bleeding risk.
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
## 2020-01-21 NOTE — ED Triage Notes (Signed)
Pt c/o fever, body aches, cough, runny nose, and fatigue x6 days. Last tylenol at 4pm but vomit 5 mins after.

## 2020-01-23 LAB — NOVEL CORONAVIRUS, NAA: SARS-CoV-2, NAA: DETECTED — AB

## 2020-01-23 LAB — SARS-COV-2, NAA 2 DAY TAT

## 2020-01-25 ENCOUNTER — Emergency Department (HOSPITAL_COMMUNITY): Payer: 59

## 2020-01-25 ENCOUNTER — Encounter (HOSPITAL_COMMUNITY): Payer: Self-pay | Admitting: Emergency Medicine

## 2020-01-25 ENCOUNTER — Other Ambulatory Visit: Payer: Self-pay

## 2020-01-25 ENCOUNTER — Emergency Department (HOSPITAL_COMMUNITY)
Admission: EM | Admit: 2020-01-25 | Discharge: 2020-01-25 | Disposition: A | Discharge status: Home / Self Care | Payer: 59 | Attending: Emergency Medicine | Admitting: Emergency Medicine

## 2020-01-25 DIAGNOSIS — U071 COVID-19: Secondary | ICD-10-CM | POA: Diagnosis not present

## 2020-01-25 DIAGNOSIS — R0602 Shortness of breath: Secondary | ICD-10-CM | POA: Diagnosis present

## 2020-01-25 DIAGNOSIS — Z5321 Procedure and treatment not carried out due to patient leaving prior to being seen by health care provider: Secondary | ICD-10-CM | POA: Diagnosis not present

## 2020-01-25 LAB — COMPREHENSIVE METABOLIC PANEL
ALT: 35 U/L (ref 0–44)
AST: 46 U/L — ABNORMAL HIGH (ref 15–41)
Albumin: 3.5 g/dL (ref 3.5–5.0)
Alkaline Phosphatase: 53 U/L (ref 38–126)
Anion gap: 13 (ref 5–15)
BUN: 14 mg/dL (ref 6–20)
CO2: 25 mmol/L (ref 22–32)
Calcium: 8.7 mg/dL — ABNORMAL LOW (ref 8.9–10.3)
Chloride: 100 mmol/L (ref 98–111)
Creatinine, Ser: 0.96 mg/dL (ref 0.61–1.24)
GFR, Estimated: >60 mL/min (ref >60)
Glucose, Bld: 103 mg/dL — ABNORMAL HIGH (ref 70–99)
Potassium: 3.8 mmol/L (ref 3.5–5.1)
Sodium: 138 mmol/L (ref 135–145)
Total Bilirubin: 0.8 mg/dL (ref 0.3–1.2)
Total Protein: 7.4 g/dL (ref 6.5–8.1)

## 2020-01-25 LAB — CBC WITH DIFFERENTIAL/PLATELET
Abs Immature Granulocytes: 0.03 10*3/uL (ref 0.00–0.07)
Basophils Absolute: 0 10*3/uL (ref 0.0–0.1)
Basophils Relative: 0 %
Eosinophils Absolute: 0 10*3/uL (ref 0.0–0.5)
Eosinophils Relative: 0 %
HCT: 46.4 % (ref 39.0–52.0)
Hemoglobin: 16.2 g/dL (ref 13.0–17.0)
Immature Granulocytes: 1 %
Lymphocytes Relative: 9 %
Lymphs Abs: 0.5 10*3/uL — ABNORMAL LOW (ref 0.7–4.0)
MCH: 30.9 pg (ref 26.0–34.0)
MCHC: 34.9 g/dL (ref 30.0–36.0)
MCV: 88.5 fL (ref 80.0–100.0)
Monocytes Absolute: 0.3 10*3/uL (ref 0.1–1.0)
Monocytes Relative: 6 %
Neutro Abs: 5.1 10*3/uL (ref 1.7–7.7)
Neutrophils Relative %: 84 %
Platelets: 157 10*3/uL (ref 150–400)
RBC: 5.24 MIL/uL (ref 4.22–5.81)
RDW: 11.9 % (ref 11.5–15.5)
WBC: 6 10*3/uL (ref 4.0–10.5)
nRBC: 0 % (ref 0.0–0.2)

## 2020-01-25 LAB — LACTIC ACID, PLASMA: Lactic Acid, Venous: 1.5 mmol/L (ref 0.5–1.9)

## 2020-01-25 MED ORDER — SODIUM CHLORIDE 0.9 % IV SOLN: Freq: Once | INTRAVENOUS | Status: DC

## 2020-01-25 MED ORDER — ALBUTEROL SULFATE HFA 108 (90 BASE) MCG/ACT IN AERS: 2.0000 | INHALATION_SPRAY | RESPIRATORY_TRACT | Status: DC | PRN

## 2020-01-25 MED ORDER — ONDANSETRON 4 MG PO TBDP
4.0000 mg | ORAL_TABLET | Freq: Once | ORAL | Status: AC
  Administered 2020-01-25: 4 mg via ORAL

## 2020-01-25 MED ORDER — ONDANSETRON 4 MG PO TBDP
ORAL_TABLET | ORAL | Status: AC
  Filled 2020-01-25: qty 1

## 2020-01-25 NOTE — ED Notes (Signed)
Patient called x3 for vitals recheck with no response 

## 2020-01-25 NOTE — ED Triage Notes (Signed)
Pt is covid positive for 3 days symtoms for 11. States "I feel like I am dying." unable to tolerate oral fluids, shortness of breath, temps of 103 at triage.

## 2020-01-30 LAB — CULTURE, BLOOD (ROUTINE X 2)
Culture: NO GROWTH
Culture: NO GROWTH
Special Requests: ADEQUATE
Special Requests: ADEQUATE

## 2021-07-14 IMAGING — DX DG CHEST 1V PORT
1 series · 1 of 1 positions shown · non-contrast
Comparison: October 08, 2015

CLINICAL DATA: Shortness of breath.  [id number] positive

EXAM:
PORTABLE CHEST 1 VIEW

[chest ap]
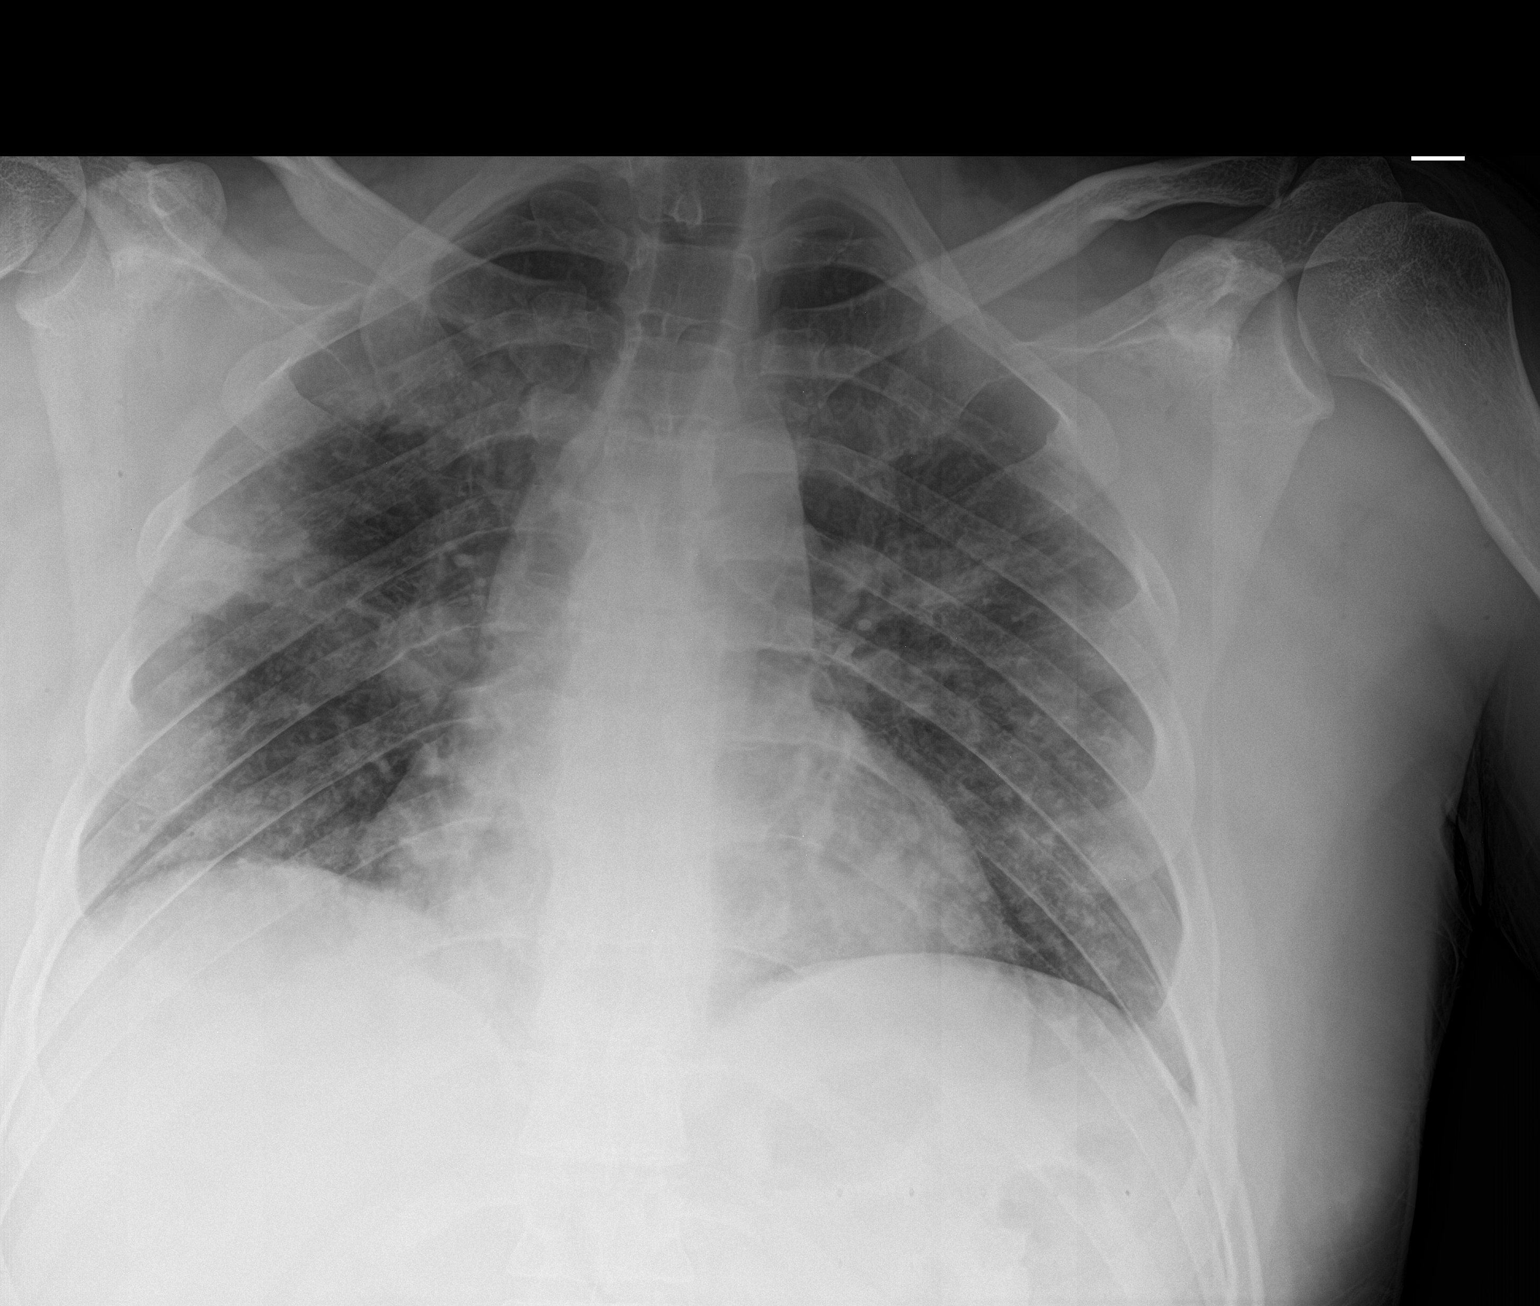

[1 of 1 positions shown; findings below may reference images not displayed]

FINDINGS: Areas of ill-defined opacity are noted in both mid and lower lung
regions as well as in the right upper lobe. No consolidation. Heart
is upper normal in size with pulmonary vascularity normal. No
adenopathy. No bone lesions.
IMPRESSION: Multiple areas of ill-defined airspace opacity, likely due to
atypical organism pneumonia. No consolidation. Heart upper normal in
size. No evident adenopathy.
